# Patient Record
Sex: Male | Born: 1980 | Race: White | Hispanic: No | Marital: Single | Smoking: Current every day smoker
Health system: Southern US, Community
[De-identification: ages and names within clinical notes are randomized; demographics above are authoritative.]

## PROBLEM LIST (undated history)

## (undated) DIAGNOSIS — F32A Depression, unspecified: Secondary | ICD-10-CM

## (undated) DIAGNOSIS — Z720 Tobacco use: Secondary | ICD-10-CM

## (undated) HISTORY — DX: Depression, unspecified: F32.A

---

## 2004-05-02 ENCOUNTER — Emergency Department: Payer: Self-pay | Admitting: Emergency Medicine

## 2004-05-23 ENCOUNTER — Emergency Department: Payer: Self-pay | Admitting: Emergency Medicine

## 2005-06-09 ENCOUNTER — Emergency Department: Payer: Self-pay | Admitting: Unknown Physician Specialty

## 2005-12-13 ENCOUNTER — Ambulatory Visit: Payer: Self-pay | Admitting: Pain Medicine

## 2005-12-23 ENCOUNTER — Ambulatory Visit: Payer: Self-pay | Admitting: Pain Medicine

## 2005-12-26 ENCOUNTER — Ambulatory Visit: Payer: Self-pay | Admitting: Pain Medicine

## 2006-01-08 ENCOUNTER — Ambulatory Visit: Payer: Self-pay | Admitting: Pain Medicine

## 2006-01-18 ENCOUNTER — Ambulatory Visit: Payer: Self-pay | Admitting: Pain Medicine

## 2006-02-05 ENCOUNTER — Ambulatory Visit: Payer: Self-pay | Admitting: Pain Medicine

## 2007-04-26 ENCOUNTER — Emergency Department (HOSPITAL_COMMUNITY): Admission: EM | Admit: 2007-04-26 | Discharge: 2007-04-26 | Payer: Self-pay | Admitting: Emergency Medicine

## 2008-06-24 ENCOUNTER — Ambulatory Visit: Payer: Self-pay

## 2009-08-30 ENCOUNTER — Ambulatory Visit: Payer: Self-pay | Admitting: Family Medicine

## 2010-11-14 ENCOUNTER — Emergency Department: Payer: Self-pay | Admitting: Emergency Medicine

## 2011-07-24 IMAGING — CT CT NECK WITH CONTRAST
2 series · 10 of 14 positions shown, 12 images · IV contrast (agent unspecified)
Comparison: none

REASON FOR EXAM: l sided abcess evaluate depth
COMMENTS:

PROCEDURE:     CT  - CT NECK WITH CONTRAST  - November 14, 2010  [DATE]
RESULT:     History: Abscess.
Comparison Study: No recent.

[Series 2: soft tissue · axial · 0.49mm/px · z∈[+298,+562]mm · 8 of 114 slices shown, 10 images]
[im 13/114  soft-tissue]
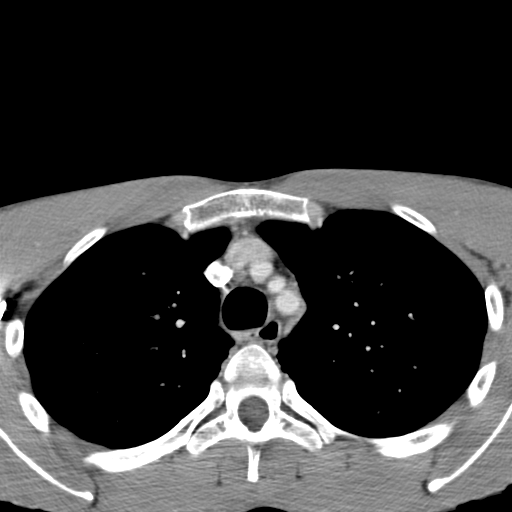
[im 13/114  bone]
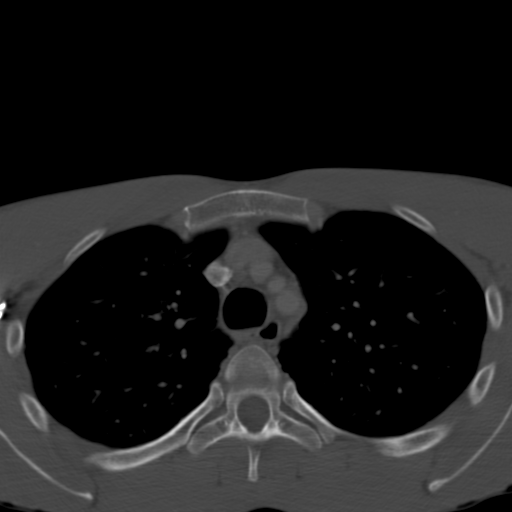
[im 26/114  bone]
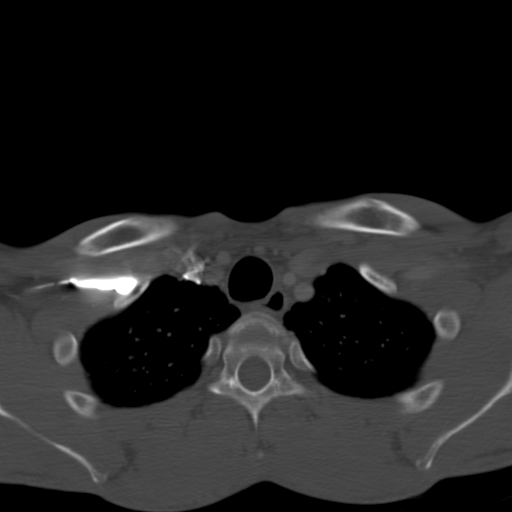
[im 38/114  bone]
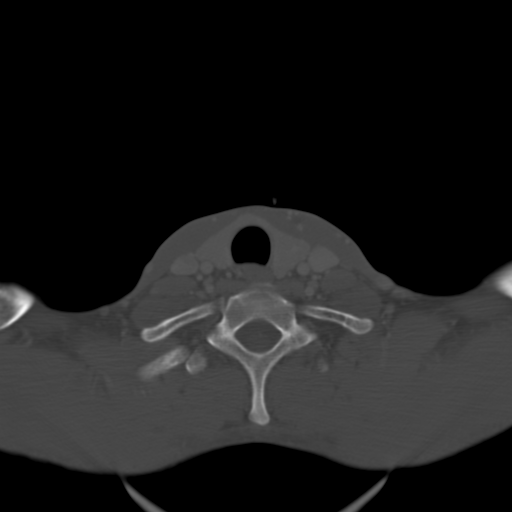
[im 51/114  bone]
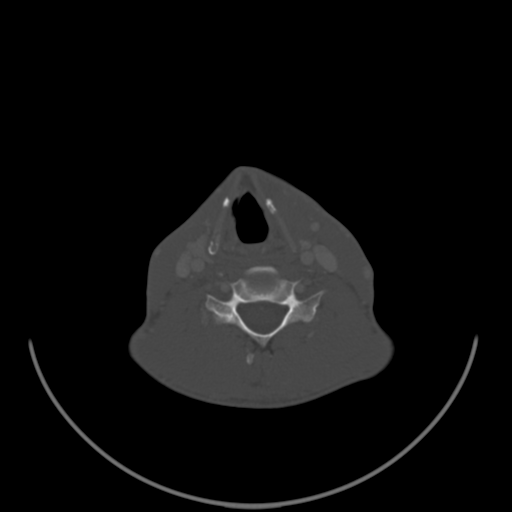
[im 63/114  soft-tissue]
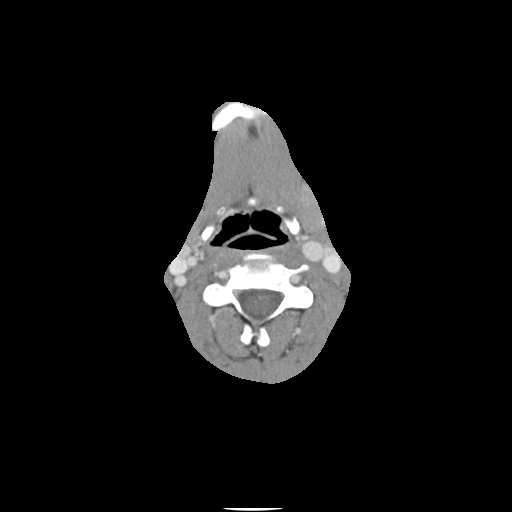
[im 63/114  bone]
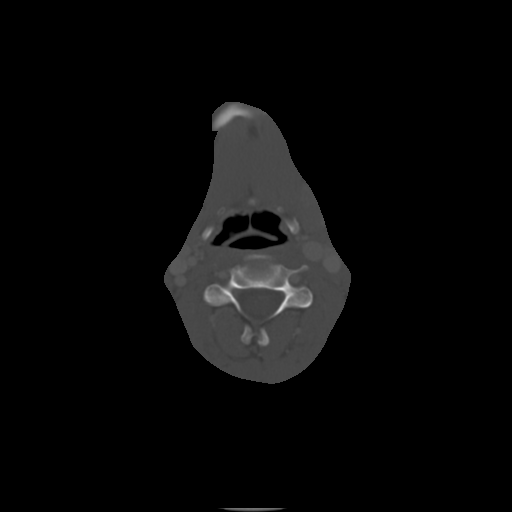
[im 76/114  bone]
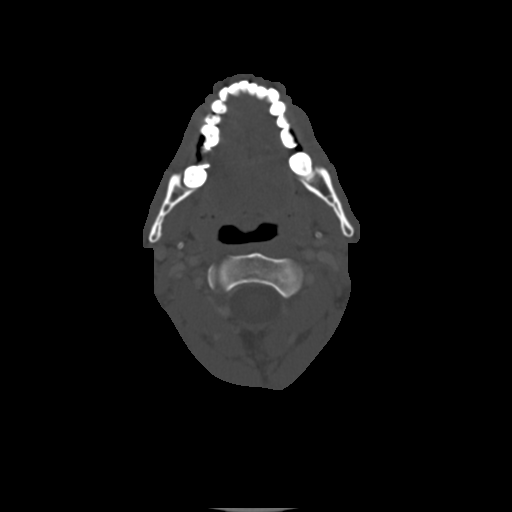
[im 88/114  bone]
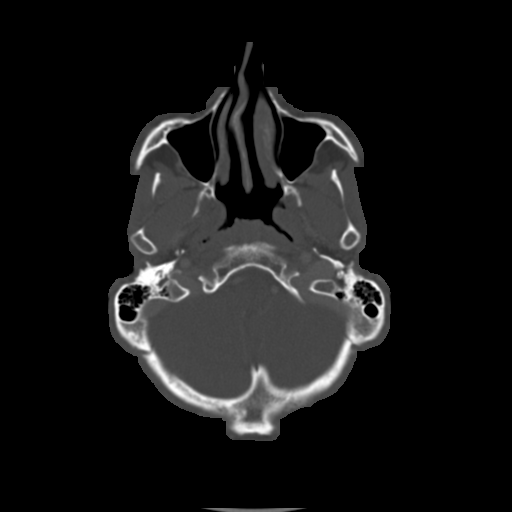
[im 101/114  bone]
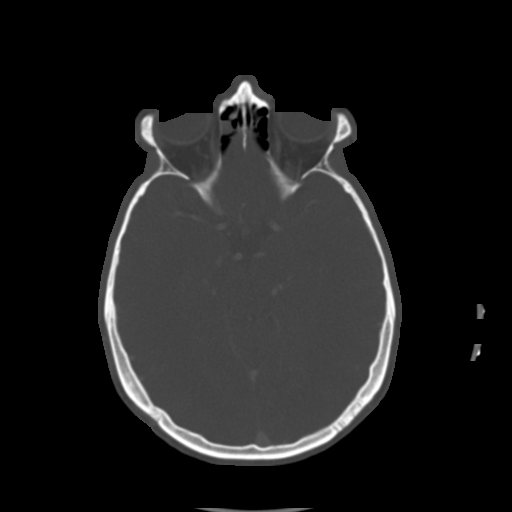

[Series 4: lung windows · axial · 0.49mm/px · z∈[+298,+334]mm · 2 of 38 slices shown]
[im 13/38  bone]
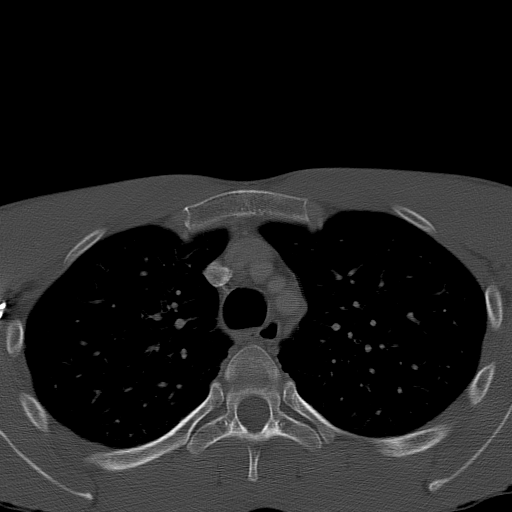
[im 25/38  bone]
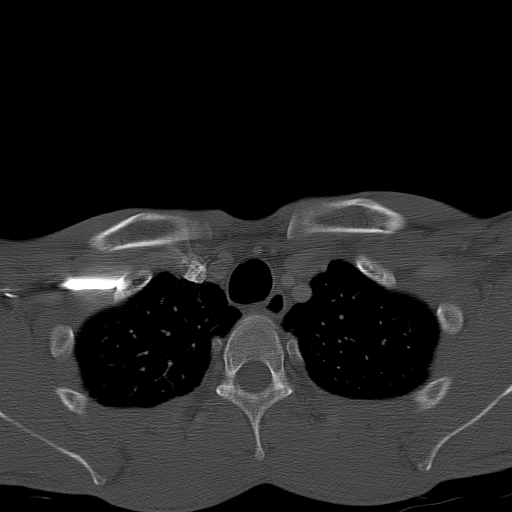

[10 of 14 positions shown; findings below may reference images not displayed]

FINDINGS: Standard CT obtained with 75 cc of [id number]. Paranasal sinuses
are clear. Larynx is normal. Salivary glands and parotid glands are normal.
There is a 2.9 x 1.6 cm complex fluid collection in the soft tissues of the
anterior neck most consistent with an abscess. Soft tissue tumor cannot be
excluded. Bilateral 1 cm jugular chain lymph nodes are noted. Vascular
structures are patent. Lung apices are normal.
IMPRESSION: 2.9 x 1.6 cm complex fluid collection in the left anterior
neck subcutaneous tissues most consistent with an abscess.

## 2012-10-07 ENCOUNTER — Emergency Department: Payer: Self-pay | Admitting: Unknown Physician Specialty
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
## 2019-10-10 ENCOUNTER — Ambulatory Visit
Admission: EM | Admit: 2019-10-10 | Discharge: 2019-10-10 | Disposition: A | Discharge status: Home / Self Care | Payer: Self-pay

## 2019-10-10 ENCOUNTER — Other Ambulatory Visit: Payer: Self-pay

## 2019-10-10 DIAGNOSIS — L0291 Cutaneous abscess, unspecified: Secondary | ICD-10-CM

## 2019-10-10 MED ORDER — SULFAMETHOXAZOLE-TRIMETHOPRIM 800-160 MG PO TABS: 1.0000 | ORAL_TABLET | Freq: Two times a day (BID) | ORAL | 0 refills | Status: AC

## 2019-10-10 NOTE — ED Provider Notes (Signed)
Roderic Palau    CSN: [id number] Arrival date & time: 10/10/19  1242      History   Chief Complaint Chief Complaint  Patient presents with  . ingrown hair    HPI DENNIE MOLTZ is a 39 y.o. male.   Patient presents with 2 ingrown hairs on his neck x2 weeks.  He attributes these to ingrown hairs and has been treating them with A&D ointment and warm compresses.  Scant intermittent drainage when he scrapes over them with his fingernail or razor.  He denies fever, chills, redness, warmth, red streaks, or other symptoms.  He denies pertinent medical history.  The history is provided by the patient.    History reviewed. No pertinent past medical history.  There are no problems to display for this patient.   History reviewed. No pertinent surgical history.     Home Medications    Prior to Admission medications   Medication Sig Start Date End Date Taking? Authorizing Provider  Vitamins A & D (VITAMIN A & D) ointment Apply 1 application topically as needed for dry skin.   Yes [provider]  sulfamethoxazole-trimethoprim (BACTRIM DS) 800-160 MG tablet Take 1 tablet by mouth 2 (two) times daily for 7 days. 10/10/19 10/17/19  Sharion Balloon, NP    Family History History reviewed. No pertinent family history.  Social History Social History   Tobacco Use  . Smoking status: Current Every Day Smoker    Packs/day: 1.00  . Smokeless tobacco: Never Used  Substance Use Topics  . Alcohol use: Not Currently  . Drug use: Yes    Frequency: 7.0 times per week    Types: Marijuana     Allergies   Patient has no known allergies.   Review of Systems Review of Systems  Constitutional: Negative for chills and fever.  HENT: Negative for ear pain and sore throat.   Eyes: Negative for pain and visual disturbance.  Respiratory: Negative for cough and shortness of breath.   Cardiovascular: Negative for chest pain and palpitations.  Gastrointestinal: Negative for  abdominal pain and vomiting.  Genitourinary: Negative for dysuria and hematuria.  Musculoskeletal: Negative for arthralgias and back pain.  Skin: Positive for wound. Negative for color change and rash.  Neurological: Negative for seizures and syncope.  All other systems reviewed and are negative.    Physical Exam Triage Vital Signs ED Triage Vitals  Enc Vitals Group     BP 10/10/19 1250 138/82     Pulse Rate 10/10/19 1250 67     Resp 10/10/19 1250 18     Temp 10/10/19 1250 98 F (36.7 C)     Temp Source 10/10/19 1250 Oral     SpO2 10/10/19 1250 99 %     Weight --      Height --      Head Circumference --      Peak Flow --      Pain Score 10/10/19 1248 4     Pain Loc --      Pain Edu? --      Excl. in [place]? --    No data found.  Updated Vital Signs BP 138/82 (BP Location: Left Arm)   Pulse 67   Temp 98 F (36.7 C) (Oral)   Resp 18   SpO2 99%   Visual Acuity Right Eye Distance:   Left Eye Distance:   Bilateral Distance:    Right Eye Near:   Left Eye Near:    Bilateral  Near:     Physical Exam Vitals and nursing note reviewed.  Constitutional:      Appearance: He is well-developed.  HENT:     Head: Normocephalic and atraumatic.     Mouth/Throat:     Mouth: Mucous membranes are moist.     Pharynx: Oropharynx is clear.  Eyes:     Conjunctiva/sclera: Conjunctivae normal.  Neck:   Cardiovascular:     Rate and Rhythm: Normal rate and regular rhythm.     Heart sounds: No murmur.  Pulmonary:     Effort: Pulmonary effort is normal. No respiratory distress.     Breath sounds: Normal breath sounds.  Abdominal:     Palpations: Abdomen is soft.     Tenderness: There is no abdominal tenderness. There is no guarding or rebound.  Musculoskeletal:     Cervical back: Neck supple.  Skin:    General: Skin is warm and dry.     Findings: Lesion present.     Comments: Abscess on right side of neck: 1 cm x 4 mm area of induration. Scant bloody drainage when opened.    Abscess on left side of neck: 1.5 cm x 1.5 cm area of induration. Moderate purulent drainage when opened.    Neurological:     General: No focal deficit present.     Mental Status: He is alert and oriented to person, place, and time.     Gait: Gait normal.  Psychiatric:        Mood and Affect: Mood normal.        Behavior: Behavior normal.      UC Treatments / Results  Labs (all labs ordered are listed, but only abnormal results are displayed) Labs Reviewed - No data to display  EKG   Radiology No results found.  Procedures Incision and Drainage  Date/Time: 10/10/2019 1:28 PM Performed by: Mickie Bail, NP Authorized by: Mickie Bail, NP   Consent:    Consent obtained:  Verbal   Consent given by:  Patient   Risks discussed:  Bleeding, incomplete drainage, infection and pain Location:    Type:  Abscess   Location:  Neck Pre-procedure details:    Skin preparation:  Antiseptic wash Anesthesia (see MAR for exact dosages):    Anesthesia method:  Local infiltration   Local anesthetic:  Lidocaine 1% w/o epi Procedure details:    Incision types:  Stab incision   Scalpel blade:  11   Drainage:  Purulent and bloody   Wound treatment:  Wound left open   Packing materials:  None Post-procedure details:    Patient tolerance of procedure:  Tolerated well, no immediate complications   (including critical care time)  Medications Ordered in UC Medications - No data to display  Initial Impression / Assessment and Plan / UC Course  I have reviewed the triage vital signs and the nursing notes.  Pertinent labs & imaging results that were available during my care of the patient were reviewed by me and considered in my medical decision making (see chart for details).   Abscess.  I&D performed.  Treating with Septra DS.  Wound care instructions and signs of worsening infection discussed.  Instructed patient to follow up with his PCP or return here if he has signs of worsening  infection.  Patient agrees to plan of care.     Final Clinical Impressions(s) / UC Diagnoses   Final diagnoses:  Abscess     Discharge Instructions     Keep  your wound clean and dry.  Wash it gently twice a day with soap and water.  Apply the antibiotic cream twice a day.    Take the antibiotic as directed.    Return here if you see signs of increased infection, such as increased pain, redness, red streaks, warmth, fever, chills, or other concerning symptoms.         ED Prescriptions    Medication Sig Dispense Auth. Provider   sulfamethoxazole-trimethoprim (BACTRIM DS) 800-160 MG tablet Take 1 tablet by mouth 2 (two) times daily for 7 days. 14 tablet Mickie Bail, NP     PDMP not reviewed this encounter.   Mickie Bail, NP 10/10/19 1330

## 2019-10-10 NOTE — ED Triage Notes (Signed)
Pt states he is having ingrown hair in his neck x 2 weeks. Pt states he is using And D ointment and hot compress without relief.

## 2019-10-10 NOTE — Discharge Instructions (Signed)
Keep your wound clean and dry.  Wash it gently twice a day with soap and water.  Apply the antibiotic cream twice a day.    Take the antibiotic as directed.    Return here if you see signs of increased infection, such as increased pain, redness, red streaks, warmth, fever, chills, or other concerning symptoms.

## 2022-07-24 ENCOUNTER — Emergency Department
Admission: EM | Admit: 2022-07-24 | Discharge: 2022-07-24 | Disposition: A | Discharge status: Home / Self Care | Payer: Worker's Compensation | Attending: Emergency Medicine | Admitting: Emergency Medicine

## 2022-07-24 ENCOUNTER — Emergency Department: Payer: Worker's Compensation

## 2022-07-24 ENCOUNTER — Encounter: Payer: Self-pay | Admitting: Emergency Medicine

## 2022-07-24 DIAGNOSIS — W540XXA Bitten by dog, initial encounter: Secondary | ICD-10-CM | POA: Diagnosis not present

## 2022-07-24 DIAGNOSIS — S60511A Abrasion of right hand, initial encounter: Secondary | ICD-10-CM | POA: Diagnosis not present

## 2022-07-24 DIAGNOSIS — S65301A Unspecified injury of deep palmar arch of right hand, initial encounter: Secondary | ICD-10-CM | POA: Diagnosis present

## 2022-07-24 DIAGNOSIS — Z23 Encounter for immunization: Secondary | ICD-10-CM | POA: Insufficient documentation

## 2022-07-24 MED ORDER — AMOXICILLIN-POT CLAVULANATE 875-125 MG PO TABS: 1.0000 | ORAL_TABLET | Freq: Two times a day (BID) | ORAL | 0 refills | Status: DC

## 2022-07-24 MED ORDER — TETANUS-DIPHTH-ACELL PERTUSSIS 5-2.5-18.5 LF-MCG/0.5 IM SUSY
0.5000 mL | PREFILLED_SYRINGE | Freq: Once | INTRAMUSCULAR | Status: AC
  Administered 2022-07-24: 0.5 mL via INTRAMUSCULAR
  Filled 2022-07-24: qty 0.5
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
## 2022-07-24 NOTE — Discharge Instructions (Addendum)
-  Please take the full course of the antibiotics as prescribed.  Patient are to wash the wound daily with soap and water.  -You may take Tylenol/ibuprofen as needed for pain.  -Return to the emergency department anytime if you begin to experience any new or worsening symptoms.
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
## 2022-07-24 NOTE — ED Notes (Signed)
Patient did not present with any paperwork for a workmans comp claim.

## 2022-07-31 ENCOUNTER — Emergency Department
Admission: EM | Admit: 2022-07-31 | Discharge: 2022-07-31 | Disposition: A | Discharge status: Home / Self Care | Payer: Self-pay | Attending: Emergency Medicine | Admitting: Emergency Medicine

## 2022-07-31 ENCOUNTER — Other Ambulatory Visit: Payer: Self-pay

## 2022-07-31 ENCOUNTER — Encounter: Payer: Self-pay | Admitting: Emergency Medicine

## 2022-07-31 DIAGNOSIS — B029 Zoster without complications: Secondary | ICD-10-CM | POA: Insufficient documentation

## 2022-07-31 LAB — BASIC METABOLIC PANEL
Anion gap: 11 (ref 5–15)
BUN: 18 mg/dL (ref 6–20)
CO2: 25 mmol/L (ref 22–32)
Calcium: 8.9 mg/dL (ref 8.9–10.3)
Chloride: 100 mmol/L (ref 98–111)
Creatinine, Ser: 0.94 mg/dL (ref 0.61–1.24)
GFR, Estimated: >60 mL/min (ref >60)
Glucose, Bld: 107 mg/dL — ABNORMAL HIGH (ref 70–99)
Potassium: 3.6 mmol/L (ref 3.5–5.1)
Sodium: 136 mmol/L (ref 135–145)

## 2022-07-31 MED ORDER — KETOROLAC TROMETHAMINE 15 MG/ML IJ SOLN
15.0000 mg | Freq: Once | INTRAMUSCULAR | Status: AC
  Administered 2022-07-31: 15 mg via INTRAVENOUS
  Filled 2022-07-31: qty 1

## 2022-07-31 MED ORDER — VALACYCLOVIR HCL 1 G PO TABS: 1000.0000 mg | ORAL_TABLET | Freq: Three times a day (TID) | ORAL | 0 refills | Status: AC

## 2022-07-31 NOTE — Discharge Instructions (Signed)
Please start taking the Valtrex as soon as possible for what appears to be shingles.  Please return for any new, worsening, or change in symptoms or other concerns including changes to your vision or eye pain.  Please follow-up with your primary care provider for recheck this week.  It was a pleasure caring for you today.  Please go to the following website to schedule new (and existing) patient appointments:   [email or web address]   The following is a list of primary care offices in the area who are accepting new patients at this time.  Please reach out to one of them directly and let them know you would like to schedule an appointment to follow up on an Emergency Department visit, and/or to establish a new primary care provider (PCP).  There are likely other primary care clinics in the are who are accepting new patients, but this is an excellent place to start:  [place] physician: Dr Lavon Paganini [address] [place], [place] [address] [phone]  [facility] Lead Physician: Dr Steele Sizer [address], [place], [place] [address] [phone]  [place] Physician: Dr Park Liter [address] [place], [place] [address] [phone]  [facility] Lead Physician: Dr Dewaine Oats [address] [place], [place] [address] [phone]  [facility] Physician: Dr Halina Maidens [address] [place], [place], [place] [address] [phone]

## 2022-07-31 NOTE — ED Triage Notes (Signed)
Pt to ED POV for right sided dental pain for past few days. Seen at [facility] yesterday and started on antibiotic. NAD noted.

## 2022-07-31 NOTE — ED Provider Notes (Signed)
[facility] Provider Note    None    (approximate)   History   Dental Pain   HPI  Terry Reyes is a 42 y.o. male who presents today for evaluation of rash to the right side of his face which he describes as burning in nature.  Rash began 3 days ago but he feels it is worsening. He reports that he thought that he had a dental infection and so went to urgent care and they prescribed antibiotics.  He reports that the rash has gotten progressively worse and now involves the right side of his scalp.  No fevers or chills.  He reports that he had chickenpox as a child.  No trouble opening his mouth.  No trouble swallowing.     Physical Exam   Triage Vital Signs: ED Triage Vitals  Enc Vitals Group     BP 07/31/22 0708 (!) 130/98     Pulse Rate 07/31/22 0708 89     Resp 07/31/22 0708 16     Temp 07/31/22 0708 97.9 F (36.6 C)     Temp Source 07/31/22 0708 Oral     SpO2 07/31/22 0708 97 %     Weight 07/31/22 0709 134 lb 7.7 oz (61 kg)     Height 07/31/22 0709 5' 11"$  (1.803 m)     Head Circumference --      Peak Flow --      Pain Score 07/31/22 0708 3     Pain Loc --      Pain Edu? --      Excl. in [place]? --     Most recent vital signs: Vitals:   07/31/22 0708  BP: (!) 130/98  Pulse: 89  Resp: 16  Temp: 97.9 F (36.6 C)  SpO2: 97%    Physical Exam Vitals and nursing note reviewed.  Constitutional:      General: Awake and alert. No acute distress.    Appearance: Normal appearance. The patient is normal weight.  HENT:     Head: Normocephalic and atraumatic.     Mouth: Mucous membranes are moist.  Diffusely poor dentition.  No trismus.  No sublingual swelling.  No uvular deviation. Eyes:     General: PERRL. Normal EOMs        Right eye: No discharge.        Left eye: No discharge.     Conjunctiva/sclera: Conjunctivae normal.  Cardiovascular:     Rate and Rhythm: Normal rate and regular rhythm.     Pulses: Normal pulses.     Heart sounds:  Normal heart sounds Pulmonary:     Effort: Pulmonary effort is normal. No respiratory distress.     Breath sounds: Normal breath sounds.  Abdominal:     Abdomen is soft. There is no abdominal tenderness. No rebound or guarding. No distention. Musculoskeletal:        General: No swelling. Normal range of motion.     Cervical back: Normal range of motion and neck supple.  Skin:    General: Skin is warm and dry.     Capillary Refill: Capillary refill takes less than 2 seconds.     Findings: Vesicular rash to the right side of face with a scabbed over lesion noted to chin, and new vesicles noted to hands right side of jaw. No involvement of nose or periorbital area. No hutchens sign Neurological:     Mental Status: The patient is awake and alert.  ED Results / Procedures / Treatments   Labs (all labs ordered are listed, but only abnormal results are displayed) Labs Reviewed  BASIC METABOLIC PANEL - Abnormal; Notable for the following components:      Result Value   Glucose, Bld 107 (*)    All other components within normal limits     EKG     RADIOLOGY     PROCEDURES:  Critical Care performed:   Procedures   MEDICATIONS ORDERED IN ED: Medications  ketorolac (TORADOL) 15 MG/ML injection 15 mg (15 mg Intravenous Given 07/31/22 0730)     IMPRESSION / MDM / ASSESSMENT AND PLAN / ED COURSE  I reviewed the triage vital signs and the nursing notes.   Differential diagnosis includes, but is not limited to, shingles, dental infection, dental decay, dental caries, contact dermatitis, allergic reaction.  Patient is awake and alert, hemodynamically stable and afebrile.  He has a vesicular rash in various stages of healing to the right side of his face and into the top of his scalp consistent with C2 dermatome.  There is negative Hutchinson sign, do not suspect eye involvement.  There is no periorbital or lid involvement.  I reviewed the patient's chart.  He has not had any  recent laboratory testing, therefore creatinine obtained for dosing of Valtrex.  He was treated symptomatically while in the ER.  From a dental standpoint, he is diffusely poor dentition, however there is no gingival fluctuance to suggest abscess.  There is no sublingual swelling to suggest Ludwig's angina.  He has no trismus.  There is no facial swelling or neck swelling.  Kidney function is within normal limits.  He was started on Valtrex.  We discussed the importance of starting this is soon as possible.  We also discussed symptomatic management.  We discussed strict return precautions.  Patient or stands and agrees with plan.  He was discharged in stable condition.  Patient's presentation is most consistent with acute complicated illness / injury requiring diagnostic workup.    FINAL CLINICAL IMPRESSION(S) / ED DIAGNOSES   Final diagnoses:  Herpes zoster without complication     Rx / DC Orders   ED Discharge Orders          Ordered    valACYclovir (VALTREX) 1000 MG tablet  3 times daily        07/31/22 0753             Note:  This document was prepared using Dragon voice recognition software and may include unintentional dictation errors.   Emeline Gins 07/31/22 [id number]    Lavonia Drafts, MD 07/31/22 1000

## 2022-08-01 ENCOUNTER — Inpatient Hospital Stay
Admission: EM | Admit: 2022-08-01 | Discharge: 2022-08-04 | DRG: 596 | Disposition: A | Discharge status: Home / Self Care | Payer: PRIVATE HEALTH INSURANCE | Attending: Internal Medicine | Admitting: Internal Medicine

## 2022-08-01 ENCOUNTER — Encounter: Payer: Self-pay | Admitting: Emergency Medicine

## 2022-08-01 ENCOUNTER — Other Ambulatory Visit: Payer: Self-pay

## 2022-08-01 DIAGNOSIS — B029 Zoster without complications: Secondary | ICD-10-CM | POA: Diagnosis not present

## 2022-08-01 DIAGNOSIS — Z720 Tobacco use: Secondary | ICD-10-CM | POA: Diagnosis not present

## 2022-08-01 DIAGNOSIS — K047 Periapical abscess without sinus: Secondary | ICD-10-CM | POA: Diagnosis present

## 2022-08-01 DIAGNOSIS — Z8249 Family history of ischemic heart disease and other diseases of the circulatory system: Secondary | ICD-10-CM

## 2022-08-01 DIAGNOSIS — F1210 Cannabis abuse, uncomplicated: Secondary | ICD-10-CM | POA: Diagnosis present

## 2022-08-01 DIAGNOSIS — B028 Zoster with other complications: Principal | ICD-10-CM

## 2022-08-01 DIAGNOSIS — Z83438 Family history of other disorder of lipoprotein metabolism and other lipidemia: Secondary | ICD-10-CM

## 2022-08-01 DIAGNOSIS — F17210 Nicotine dependence, cigarettes, uncomplicated: Secondary | ICD-10-CM | POA: Diagnosis present

## 2022-08-01 DIAGNOSIS — R21 Rash and other nonspecific skin eruption: Secondary | ICD-10-CM | POA: Diagnosis not present

## 2022-08-01 HISTORY — DX: Tobacco use: Z72.0

## 2022-08-01 LAB — PATHOLOGIST SMEAR REVIEW

## 2022-08-01 LAB — CBC WITH DIFFERENTIAL/PLATELET
Abs Immature Granulocytes: 0.03 10*3/uL (ref 0.00–0.07)
Basophils Absolute: 0.1 10*3/uL (ref 0.0–0.1)
Basophils Relative: 1 %
Eosinophils Absolute: 0 10*3/uL (ref 0.0–0.5)
Eosinophils Relative: 0 %
HCT: 43.3 % (ref 39.0–52.0)
Hemoglobin: 14.7 g/dL (ref 13.0–17.0)
Immature Granulocytes: 0 %
Lymphocytes Relative: 23 %
Lymphs Abs: 1.6 10*3/uL (ref 0.7–4.0)
MCH: 29.9 pg (ref 26.0–34.0)
MCHC: 33.9 g/dL (ref 30.0–36.0)
MCV: 88 fL (ref 80.0–100.0)
Monocytes Absolute: 0.9 10*3/uL (ref 0.1–1.0)
Monocytes Relative: 14 %
Neutro Abs: 4.2 10*3/uL (ref 1.7–7.7)
Neutrophils Relative %: 62 %
Platelets: 174 10*3/uL (ref 150–400)
RBC: 4.92 MIL/uL (ref 4.22–5.81)
RDW: 11.9 % (ref 11.5–15.5)
Smear Review: NORMAL
WBC: 6.7 10*3/uL (ref 4.0–10.5)
nRBC: 0 % (ref 0.0–0.2)

## 2022-08-01 LAB — COMPREHENSIVE METABOLIC PANEL
ALT: 71 U/L — ABNORMAL HIGH (ref 0–44)
AST: 62 U/L — ABNORMAL HIGH (ref 15–41)
Albumin: 3.8 g/dL (ref 3.5–5.0)
Alkaline Phosphatase: 43 U/L (ref 38–126)
Anion gap: 9 (ref 5–15)
BUN: 19 mg/dL (ref 6–20)
CO2: 25 mmol/L (ref 22–32)
Calcium: 8.6 mg/dL — ABNORMAL LOW (ref 8.9–10.3)
Chloride: 98 mmol/L (ref 98–111)
Creatinine, Ser: 0.79 mg/dL (ref 0.61–1.24)
GFR, Estimated: >60 mL/min (ref >60)
Glucose, Bld: 104 mg/dL — ABNORMAL HIGH (ref 70–99)
Potassium: 3.5 mmol/L (ref 3.5–5.1)
Sodium: 132 mmol/L — ABNORMAL LOW (ref 135–145)
Total Bilirubin: 0.5 mg/dL (ref 0.3–1.2)
Total Protein: 7.5 g/dL (ref 6.5–8.1)

## 2022-08-01 LAB — PROTIME-INR
INR: 1.1 (ref 0.8–1.2)
Prothrombin Time: 14.2 seconds (ref 11.4–15.2)

## 2022-08-01 LAB — SEDIMENTATION RATE: Sed Rate: 11 mm/hr (ref 0–15)

## 2022-08-01 LAB — LACTIC ACID, PLASMA
Lactic Acid, Venous: 0.9 mmol/L (ref 0.5–1.9)
Lactic Acid, Venous: 0.7 mmol/L (ref 0.5–1.9)

## 2022-08-01 LAB — PROCALCITONIN: Procalcitonin: <0.1 ng/mL

## 2022-08-01 LAB — RPR: RPR Ser Ql: NONREACTIVE

## 2022-08-01 LAB — HIV ANTIBODY (ROUTINE TESTING W REFLEX): HIV Screen 4th Generation wRfx: NONREACTIVE

## 2022-08-01 MED ORDER — METRONIDAZOLE 500 MG/100ML IV SOLN
500.0000 mg | Freq: Two times a day (BID) | INTRAVENOUS | Status: DC
  Administered 2022-08-01 – 2022-08-03 (×5): 500 mg via INTRAVENOUS
  Filled 2022-08-01 (×5): qty 100

## 2022-08-01 MED ORDER — SODIUM CHLORIDE 0.9 % IV SOLN
INTRAVENOUS | Status: DC
  Administered 2022-08-04: 1000 mL via INTRAVENOUS

## 2022-08-01 MED ORDER — DEXTROSE 5 % IV SOLN
10.0000 mg/kg | Freq: Three times a day (TID) | INTRAVENOUS | Status: DC
  Administered 2022-08-01 – 2022-08-03 (×7): 610 mg via INTRAVENOUS
  Filled 2022-08-01 (×7): qty 12.2
  Filled 2022-08-01: qty 10

## 2022-08-01 MED ORDER — SODIUM CHLORIDE 0.9 % IV SOLN: Freq: Once | INTRAVENOUS | Status: AC

## 2022-08-01 MED ORDER — NICOTINE 21 MG/24HR TD PT24
21.0000 mg | MEDICATED_PATCH | Freq: Every day | TRANSDERMAL | Status: DC
  Filled 2022-08-01 (×3): qty 1

## 2022-08-01 MED ORDER — CEFAZOLIN SODIUM-DEXTROSE 2-4 GM/100ML-% IV SOLN
2.0000 g | Freq: Three times a day (TID) | INTRAVENOUS | Status: DC
  Administered 2022-08-01 – 2022-08-03 (×6): 2 g via INTRAVENOUS
  Filled 2022-08-01 (×6): qty 100

## 2022-08-01 MED ORDER — ACETAMINOPHEN 325 MG PO TABS
650.0000 mg | ORAL_TABLET | Freq: Four times a day (QID) | ORAL | Status: DC | PRN
  Administered 2022-08-02: 650 mg via ORAL
  Filled 2022-08-01: qty 2

## 2022-08-01 MED ORDER — HEPARIN SODIUM (PORCINE) 5000 UNIT/ML IJ SOLN: 5000.0000 [IU] | Freq: Three times a day (TID) | INTRAMUSCULAR | Status: DC

## 2022-08-01 MED ORDER — ONDANSETRON HCL 4 MG/2ML IJ SOLN: 4.0000 mg | Freq: Three times a day (TID) | INTRAMUSCULAR | Status: DC | PRN

## 2022-08-01 MED ORDER — ENOXAPARIN SODIUM 40 MG/0.4ML IJ SOSY
40.0000 mg | PREFILLED_SYRINGE | Freq: Every day | INTRAMUSCULAR | Status: DC
  Administered 2022-08-01 – 2022-08-03 (×3): 40 mg via SUBCUTANEOUS
  Filled 2022-08-01 (×4): qty 0.4

## 2022-08-01 MED ORDER — OXYCODONE-ACETAMINOPHEN 5-325 MG PO TABS
1.0000 | ORAL_TABLET | ORAL | Status: DC | PRN
  Administered 2022-08-01 – 2022-08-04 (×5): 1 via ORAL
  Filled 2022-08-01 (×5): qty 1

## 2022-08-01 MED ORDER — MORPHINE SULFATE (PF) 4 MG/ML IV SOLN
4.0000 mg | Freq: Once | INTRAVENOUS | Status: AC
  Administered 2022-08-01: 4 mg via INTRAVENOUS
  Filled 2022-08-01: qty 1

## 2022-08-01 MED ORDER — DEXTROSE 5 % IV SOLN
10.0000 mg/kg | Freq: Once | INTRAVENOUS | Status: AC
  Administered 2022-08-01: 610 mg via INTRAVENOUS
  Filled 2022-08-01: qty 12.2

## 2022-08-01 MED ORDER — SODIUM CHLORIDE 0.9 % IV SOLN: Freq: Once | INTRAVENOUS | Status: DC

## 2022-08-01 MED ORDER — ONDANSETRON HCL 4 MG/2ML IJ SOLN
4.0000 mg | INTRAMUSCULAR | Status: AC
  Administered 2022-08-01: 4 mg via INTRAVENOUS
  Filled 2022-08-01: qty 2

## 2022-08-01 NOTE — ED Provider Notes (Signed)
[facility] Provider Note    Event Date/Time   First MD Initiated Contact with Patient 08/01/22 0411     (approximate)   History   Facial Pain   HPI  Terry Reyes is a 42 y.o. male who presents for worsening pain and rash primarily to the right side of his face and head.  The symptoms have been going on for about 5 days.  He came to the emergency department yesterday and was diagnosed with shingles on his face.  He was started on Valtrex and discharged home.  However he said that he has gotten significantly worse since yesterday.  He has not been able to eat or drink much at all for the last few days due to the pain in his mouth and on his lips.  He feels ill in general.  No known fever or chills but general malaise.  The pain is severe all along the right side of his face and he said he now has lesions on the top of his head and all throughout his scalp.  He also has some on the back of his neck.  He said that he feels like he is getting closer to his eye and now his eyelid is shut some of the time.  He does not have pain in his right eye and his vision does not seem to be compromised when his eyes are open wide.  He is unaware of any immunosuppression including HIV.  He does not take any medications regularly.  He recently took a course of Augmentin for a dog bite to his hand.  He said that the hand is doing much better.  The rash did start after the amoxicillin but does seem to be isolated to the right side of his face and head.     Physical Exam   Triage Vital Signs: ED Triage Vitals  Enc Vitals Group     BP 08/01/22 0357 (!) 121/99     Pulse Rate 08/01/22 0357 (!) 109     Resp 08/01/22 0357 16     Temp 08/01/22 0357 98.9 F (37.2 C)     Temp Source 08/01/22 0357 Oral     SpO2 08/01/22 0357 96 %     Weight 08/01/22 0358 61.2 kg (135 lb)     Height 08/01/22 0358 1.778 m (5' 10"$ )     Head Circumference --      Peak Flow --      Pain Score 08/01/22  0358 3     Pain Loc --      Pain Edu? --      Excl. in [place]? --     Most recent vital signs: Vitals:   08/01/22 0700 08/01/22 0714  BP: 122/76   Pulse: 73   Resp: 18   Temp:  99.5 F (37.5 C)  SpO2: 97%      General: Awake, ill-appearing if not toxic.  Alert and oriented. CV:  Good peripheral perfusion.  Regular rate and rhythm. Resp:  Normal effort. No accessory muscle usage nor intercostal retractions.   Abd:  No distention.  No tenderness to palpation. Other:  Patient has extensive vesicular lesions all throughout the right side of his face.  Some of these are crusted and some of them are more fresh and open.  He has crusting and dried and cracked right side of his lower lip as well as intraoral mucosal lesions.  He has right eyelid droop and periorbital and  lid edema.  There is no obvious conjunctivitis nor episcleritis.  Extraocular motion is intact and pupils are equal and reactive.  No lesions within the ear canal which would be suggestive of Ramsay Hunt syndrome.  He has lesions on the back of his neck as well as throughout the right side of his scalp as well.  I do not see any lesions on his hands or arms.         ED Results / Procedures / Treatments   Labs (all labs ordered are listed, but only abnormal results are displayed) Labs Reviewed  COMPREHENSIVE METABOLIC PANEL - Abnormal; Notable for the following components:      Result Value   Sodium 132 (*)    Glucose, Bld 104 (*)    Calcium 8.6 (*)    AST 62 (*)    ALT 71 (*)    All other components within normal limits  CULTURE, BLOOD (ROUTINE X 2)  CULTURE, BLOOD (ROUTINE X 2)  LACTIC ACID, PLASMA  CBC WITH DIFFERENTIAL/PLATELET  PROTIME-INR  HIV ANTIBODY (ROUTINE TESTING W REFLEX)  LACTIC ACID, PLASMA  RPR  PATHOLOGIST SMEAR REVIEW     PROCEDURES:  Critical Care performed: No  Procedures   MEDICATIONS ORDERED IN ED: Medications  0.9 %  sodium chloride infusion (has no administration in time  range)  morphine (PF) 4 MG/ML injection 4 mg (4 mg Intravenous Given 08/01/22 0712)  ondansetron (ZOFRAN) injection 4 mg (4 mg Intravenous Given 08/01/22 [id number])  acyclovir (ZOVIRAX) 610 mg in dextrose 5 % 100 mL IVPB (610 mg Intravenous New Bag/Given 08/01/22 0712)  0.9 %  sodium chloride infusion ( Intravenous New Bag/Given 08/01/22 0711)     IMPRESSION / MDM / ASSESSMENT AND PLAN / ED COURSE  I reviewed the triage vital signs and the nursing notes.                              Differential diagnosis includes, but is not limited to, herpes zoster, disseminated herpes zoster, herpes zoster ophthalmicus, contact dermatitis, bullous pemphigoid, drug-induced bullous disorders, SJS/TN, varicella, erythema multiforme.  Patient's presentation is most consistent with acute presentation with potential threat to life or bodily function.  Labs/studies ordered: CBC with differential, lactic acid, blood cultures x 2, CMP, pro time-INR, RPR, HIV antibody with reflex. Interventions/Medications given: Acyclovir 10 mg/kg IV (per pharmacy consult), Zofran 4 mg IV, morphine 4 mg IV, normal saline infusion at 125 mL/h. [facility] Course my include additional interventions not listed in this section:)  The patient has impressive and concerning herpes zoster on the right side of his face that is progressed substantially over the last 24 hours and spite of starting outpatient treatment of valacyclovir.  Most concerning is the pain he is having in his mouth that is making it very difficult for him to tolerate oral intake, as well as the fact that the rash is spreading closer and closer to his eye.  He now has lid droop and periorbital/eyelid edema.  There is no obvious involvement of the globe at this time but I feel he would benefit from a nonemergent ophthalmic consultation for additional evaluation.    Given the patient's pain, inability tolerate oral intake to take his medicines or stay hydrated, and concerning for  relatively rapidly worsening shingles with possible ocular involvement in the setting of failed outpatient treatment, I feel patient would benefit from inpatient treatment on the hospital service with possible consults to  ophthalmology and infectious disease.  I talked about this plan with the patient and he is very much in agreement.  I ordered acyclovir IV per pharmacy consult for appropriate dosing.  Medications as listed above for pain and antiemetics.  He is not tachycardic so I will provide normal saline infusion at 125 an hour rather than boluses at this time.  Once labs are back I will consult the hospitalist team.  The patient is on the cardiac monitor to evaluate for evidence of arrhythmia and/or significant heart rate changes.   Clinical Course as of 08/01/22 0815  Tue Aug 01, 2022  [id number] Lactic Acid, Venous: 0.9 [CF]  [id number] Lab results are generally reassuring with no emergent abnormalities.  No evidence of (bacterial) sepsis.  Consulting the hospitalist for admission as previously described. [CF]  [id number] Consulted Dr. Blaine Hamper with the hospitalist team who will admit. [CF]    Clinical Course User Index [CF] Hinda Kehr, MD     FINAL CLINICAL IMPRESSION(S) / ED DIAGNOSES   Final diagnoses:  Herpes zoster with other complication     Rx / DC Orders   ED Discharge Orders     None        Note:  This document was prepared using Dragon voice recognition software and may include unintentional dictation errors.   Hinda Kehr, MD 08/01/22 [phone]

## 2022-08-01 NOTE — Consult Note (Signed)
NAME: Terry Reyes  DOB: Mar 12, 1981  MRN: [id number]  Date/Time: 08/01/2022 1:30 PM  REQUESTING PROVIDER: Dr.Niu Subjective:  REASON FOR CONSULT: Shingles ?virtual video visit Terry Reyes is a 42 y.o. 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 No pertinent surgical history.  Social History   Socioeconomic History   Marital status: Single    Spouse name: Not on file   Number of children: Not on file   Years of education: Not on file   Highest education level: Not on file  Occupational History   Not on file  Tobacco Use   Smoking status: Every Day    Packs/day: 1.00    Types: Cigarettes   Smokeless tobacco: Never  Substance and Sexual Activity   Alcohol use: Not Currently   Drug use: Yes    Frequency: 7.0 times per week    Types: Marijuana   Sexual activity: Not Currently  Other Topics Concern   Not on file  Social History Narrative   Not on file   Social Determinants of Health   Financial Resource Strain: Not on file  Food Insecurity: Not on file  Transportation Needs: Not on file  Physical Activity: Not on file  Stress: Not on file  Social Connections: Not on file  Intimate Partner Violence: Not on file    Family History  Problem Relation Age of Onset   Hyperlipidemia Father    Seizures Father    Hypertension Father     No Known Allergies I? Current Facility-Administered Medications  Medication Dose Route Frequency Provider Last Rate Last Admin   0.9 %  sodium chloride infusion   Intravenous Continuous Ivor Costa, MD       acetaminophen (TYLENOL) tablet 650 mg  650 mg Oral Q6H PRN Ivor Costa, MD       acyclovir (ZOVIRAX) 610 mg in dextrose 5 % 100 mL IVPB  10 mg/kg Intravenous Q8H Miller, Justin E, RPH       enoxaparin (LOVENOX) injection 40 mg  40 mg Subcutaneous Daily Ivor Costa, MD   40 mg at 08/01/22 1118   nicotine (NICODERM CQ - dosed in mg/24 hours) patch 21 mg  21 mg Transdermal Daily Ivor Costa, MD       ondansetron [facility]) injection 4 mg  4 mg Intravenous Q8H PRN Ivor Costa, MD       oxyCODONE-acetaminophen (PERCOCET/ROXICET) 5-325 MG per tablet 1 tablet  1 tablet Oral Q4H PRN Ivor Costa, MD       Current Outpatient Medications  Medication Sig Dispense Refill   amoxicillin-clavulanate (AUGMENTIN) 875-125 MG tablet Take 1 tablet by mouth 2 (two) times daily for 10 days. 20 tablet 0   clindamycin (CLEOCIN) 300 MG capsule  Take 300 mg by mouth every 8 (eight) hours.     valACYclovir (VALTREX) 1000 MG tablet Take 1 tablet (1,000 mg total) by mouth 3 (three) times daily for 7 days. 21 tablet 0   Vitamins A & D (VITAMIN A & D) ointment Apply 1 application topically as needed for dry skin.       Abtx:  Anti-infectives (From admission, onward)    Start     Dose/Rate Route Frequency Ordered Stop   08/01/22 1400  acyclovir (ZOVIRAX) 610 mg in dextrose 5 % 100 mL IVPB        10 mg/kg  61.2 kg 112.2 mL/hr over 60 Minutes Intravenous Every 8 hours 08/01/22 0910     08/01/22 0630  acyclovir (ZOVIRAX) 610 mg in dextrose 5 % 100 mL IVPB        10 mg/kg  61.2 kg 112.2 mL/hr over 60 Minutes Intravenous  Once 08/01/22 0625 08/01/22 [id number]       REVIEW OF SYSTEMS:  Const: negative fever, negative chills, negative weight loss Eyes: swelling of rt upper lid ENT: negative coryza, negative sore  throat Swollen lips Resp: negative cough, hemoptysis, dyspnea Cards: negative for chest pain, palpitations, lower extremity edema GU: negative for frequency, dysuria and hematuria GI: because of swollen lips not eating well Skin: as above Heme: negative for easy bruising and gum/nose bleeding MS: weakness Neurolo:negative for headaches, dizziness, vertigo, memory problems  Psych: negative for feelings of anxiety, depression  Endocrine: negative for thyroid, diabetes Allergy/Immunology- negative for any medication or food allergies ?  Objective:  VITALS:  BP 114/74   Pulse 71   Temp 98.8 F (37.1 C) (Oral)   Resp 12   Ht 5' 10"$  (1.778 m)   Wt 61.2 kg   SpO2 97%   BMI 19.37 kg/m   PHYSICAL EXAM: thru video General: Alert, cooperative, some  distress, .  Head: Normocephalic, without obvious abnormality, atraumatic upper lid swollen  ENT lip swollen on the rt side Face- rt side- vesicular rash with some crusting            Some erythema Neck:  symmetrical, no swelling Extremities: Rt hand- dog bite area-healing . No edema. No clubbing Skin: No rashes or lesions. Or bruising Lymph: Cervical, supraclavicular normal. Neurologic: Grossly non-focal Pertinent Labs Lab Results CBC    Component Value Date/Time   WBC 6.7 08/01/2022 0614   RBC 4.92 08/01/2022 0614   HGB 14.7 08/01/2022 0614   HCT 43.3 08/01/2022 0614   PLT 174 08/01/2022 0614   MCV 88.0 08/01/2022 0614   MCH 29.9 08/01/2022 0614   MCHC 33.9 08/01/2022 0614   RDW 11.9 08/01/2022 0614   LYMPHSABS 1.6 08/01/2022 0614   MONOABS 0.9 08/01/2022 0614   EOSABS 0.0 08/01/2022 0614   BASOSABS 0.1 08/01/2022 0614       Latest Ref Rng & Units 08/01/2022    6:14 AM 07/31/2022    7:19 AM  CMP  Glucose 70 - 99 mg/dL 104  107   BUN 6 - 20 mg/dL 19  18   Creatinine 0.61 - 1.24 mg/dL 0.79  0.94   Sodium 135 - 145 mmol/L 132  136   Potassium 3.5 - 5.1 mmol/L 3.5  3.6   Chloride 98 - 111 mmol/L 98  100    CO2 22 - 32 mmol/L 25  25   Calcium 8.9 - 10.3 mg/dL 8.6  8.9   Total Protein 6.5 - 8.1 g/dL 7.5    Total  Bilirubin 0.3 - 1.2 mg/dL 0.5    Alkaline Phos 38 - 126 U/L 43    AST 15 - 41 U/L 62    ALT 0 - 44 U/L 71        Microbiology: Blood culture sent  IMAGING RESULTS: none ? Impression/Recommendation Rt facial rash- Herpes zoster is the likely diagnosis Will do a swab for VZV PCR Agree with Iv acyclovir Some crusting- possible secondary infection Rt side of tongue numb-   Tooth ache- rt side lower premolar-r/o abscess- will give antibiotics - cefazolin + flagyl  Recent Dog bite- no infection at the site   ? ? ? ___________________________________________________ Discussed with patient, requesting provider

## 2022-08-01 NOTE — ED Triage Notes (Signed)
Pt to ED from home c/o right facial pain and rash x4 days.  Was seen here yesterday for the same.  States bumps on face have been draining.  Reports sweating but not checked temperature.  Rash has spread to scalp.

## 2022-08-01 NOTE — H&P (Addendum)
History and Physical    Terry Reyes [id number] DOB: [birth year] DOA: 08/01/2022  Referring MD/NP/PA:   PCP: Pcp, No   Patient coming from:  The patient is coming from home.  At baseline, pt is independent for most of ADL.        Chief Complaint: Facial rash  HPI: Terry Reyes is a 42 y.o. male with medical history significant of tobacco abuse, marijuana abuse, who presents with facial rash.    Patient states that his facial rash started 5 days ago, which has been progressively worsening, now involves the whole right face, right-sided and scalp.  Patient has numbness, no significant pain.  No fever or chills.  Patient does not have chest pain, cough, shortness breath.  He had some diarrhea in the past several days, which has resolved.  Currently no active diarrhea.  Denies nausea vomiting or abdominal pain.  No symptoms of UTI.  Patient has a poor dentition.  Of note, patient had dog bite to the right hand, and was seen in ED on 2/5. He said that he finished a course of Augmentin.  Patient had history of chickenpox in childhood.  Patient was seen in ED yesterday, and started oral Valtrex, no significant improvement.   Data reviewed independently and ED Course: pt was found to have WBC 6.7, GFR> 60, temperature 99.5, blood pressure 122/76, heart rate of 109, 73, 18, oxygen saturation 97% on room air.  Patient is placed on MedSurg bed for patient, consulted Dr. Delaine Lame of ID  EKG: I have personally reviewed.  Sinus rhythm, QTc 418, no ischemic change.   Review of Systems:   General: no fevers, chills, no body weight gain, fatigue HEENT: no blurry vision, hearing changes or sore throat Respiratory: no dyspnea, coughing, wheezing CV: no chest pain, no palpitations GI: no nausea, vomiting, abdominal pain, diarrhea, constipation GU: no dysuria, burning on urination, increased urinary frequency, hematuria  Ext: no leg edema Neuro: no unilateral weakness, numbness, or tingling, no  vision change or hearing loss Skin: has facial rash. MSK: No muscle spasm, no deformity, no limitation of range of movement in spin Heme: No easy bruising.  Travel history: No recent long distant travel.   Allergy: No Known Allergies  Past Medical History:  Diagnosis Date   Tobacco abuse     History reviewed. No pertinent surgical history.  Social History:  reports that he has been smoking. He has been smoking an average of 1 pack per day. He has never used smokeless tobacco. He reports that he does not currently use alcohol. He reports current drug use. Frequency: 7.00 times per week. Drug: Marijuana.  Family History:  Family History  Problem Relation Age of Onset   Hyperlipidemia Father    Seizures Father    Hypertension Father      Prior to Admission medications   Medication Sig Start Date End Date Taking? Authorizing Provider  amoxicillin-clavulanate (AUGMENTIN) 875-125 MG tablet Take 1 tablet by mouth 2 (two) times daily for 10 days. 07/24/22 08/03/22  Teodoro Spray, PA  valACYclovir (VALTREX) 1000 MG tablet Take 1 tablet (1,000 mg total) by mouth 3 (three) times daily for 7 days. 07/31/22 08/07/22  Poggi, Clarnce Flock, PA-C  Vitamins A & D (VITAMIN A & D) ointment Apply 1 application topically as needed for dry skin.    [provider]    Physical Exam: Vitals:   08/01/22 0630 08/01/22 0700 08/01/22 0714 08/01/22 0800  BP: 123/86 122/76  117/70  Pulse: 78 73  67  Resp:  18    Temp:   99.5 F (37.5 C) 98.8 F (37.1 C)  TempSrc:   Oral   SpO2: 98% 97%  96%  Weight:      Height:       General: Not in acute distress.  Dry mucous membranes HEENT: Patient has poor dentition.       Eyes: PERRL, EOMI, no scleral icterus.       ENT: No discharge from the ears and nose, no pharynx injection, no tonsillar enlargement.        Neck: No JVD, no bruit, no mass felt. Heme: No neck lymph node enlargement. Cardiac: S1/S2, RRR, No murmurs, No gallops or  rubs. Respiratory: No rales, wheezing, rhonchi or rubs. GI: Soft, nondistended, nontender, no rebound pain, no organomegaly, BS present. GU: No hematuria Ext: No pitting leg edema bilaterally. 1+DP/PT pulse bilaterally. Musculoskeletal: No joint deformities, No joint redness or warmth, no limitation of ROM in spin. Skin: Has facial rash as below, mainly in the right face, also involves right eyelid, spread to scalp, right lower face seems to have scabbed blisters                 Neuro: Alert, oriented X3, cranial nerves II-XII grossly intact, moves all extremities normally.  Psych: Patient is not psychotic, no suicidal or hemocidal ideation.  Labs on Admission: I have personally reviewed following labs and imaging studies  CBC: Recent Labs  Lab 08/01/22 0614  WBC 6.7  NEUTROABS 4.2  HGB 14.7  HCT 43.3  MCV 88.0  PLT [id number]   Basic Metabolic Panel: Recent Labs  Lab 07/31/22 0719 08/01/22 0614  NA 136 132*  K 3.6 3.5  CL 100 98  CO2 25 25  GLUCOSE 107* 104*  BUN 18 19  CREATININE 0.94 0.79  CALCIUM 8.9 8.6*   GFR: Estimated Creatinine Clearance: 104.1 mL/min (by C-G formula based on SCr of 0.79 mg/dL). Liver Function Tests: Recent Labs  Lab 08/01/22 0614  AST 62*  ALT 71*  ALKPHOS 43  BILITOT 0.5  PROT 7.5  ALBUMIN 3.8   No results for input(s): "LIPASE", "AMYLASE" in the last 168 hours. No results for input(s): "AMMONIA" in the last 168 hours. Coagulation Profile: Recent Labs  Lab 08/01/22 0614  INR 1.1   Cardiac Enzymes: No results for input(s): "CKTOTAL", "CKMB", "CKMBINDEX", "TROPONINI" in the last 168 hours. BNP (last 3 results) No results for input(s): "PROBNP" in the last 8760 hours. HbA1C: No results for input(s): "HGBA1C" in the last 72 hours. CBG: No results for input(s): "GLUCAP" in the last 168 hours. Lipid Profile: No results for input(s): "CHOL", "HDL", "LDLCALC", "TRIG", "CHOLHDL", "LDLDIRECT" in the last 72 hours. Thyroid  Function Tests: No results for input(s): "TSH", "T4TOTAL", "FREET4", "T3FREE", "THYROIDAB" in the last 72 hours. Anemia Panel: No results for input(s): "VITAMINB12", "FOLATE", "FERRITIN", "TIBC", "IRON", "RETICCTPCT" in the last 72 hours. Urine analysis: No results found for: "COLORURINE", "APPEARANCEUR", "LABSPEC", "PHURINE", "GLUCOSEU", "HGBUR", "BILIRUBINUR", "KETONESUR", "PROTEINUR", "UROBILINOGEN", "NITRITE", "LEUKOCYTESUR" Sepsis Labs: @LABRCNTIP$ (procalcitonin:4,lacticidven:4) )No results found for this or any previous visit (from the past 240 hour(s)).   Radiological Exams on Admission: No results found.    Assessment/Plan Principal Problem:   Facial rash Active Problems:   Shingles-face   Tobacco abuse   Assessment and Plan:  Facial rash due to possible shingles-face: His rash involves right eyelid and scalp, no vision change. Consulted Dr. Delaine Lame of ID.  -Placed on MedSurg bed for  completion -Start IV acyclovir -IVF: 150 cc/h normal saline -Varicella zoster PCR and aerobic culture with Gram stain per Dr. Delaine Lame  Tobacco abuse:  -Nicotine patch     DVT ppx:  SQ Lovenox  Code Status: Full code  Family Communication: not done, no family member is at bed side.    Disposition Plan:  Anticipate discharge back to previous environment  Consults called:  Dr. Theodoro Grist of ID  Admission status and Level of care: Med-Surg:  for obs    Dispo: The patient is from: Home              Anticipated d/c is to: Home              Anticipated d/c date is: 1 day              Patient currently is not medically stable to d/c.    Severity of Illness:  The appropriate patient status for this patient is OBSERVATION. Observation status is judged to be reasonable and necessary in order to provide the required intensity of service to ensure the patient's safety. The patient's presenting symptoms, physical exam findings, and initial radiographic and laboratory data in  the context of their medical condition is felt to place them at decreased risk for further clinical deterioration. Furthermore, it is anticipated that the patient will be medically stable for discharge from the hospital within 2 midnights of admission.        Date of Service 08/01/2022    Ivor Costa [facility]   If 7PM-7AM, please contact night-coverage [email or web address] 08/01/2022, 10:22 AM

## 2022-08-01 NOTE — Consult Note (Signed)
Pharmacy Antibiotic Note  Terry Reyes is a 42 y.o. male admitted on 08/01/2022 with shingles. Pharmacy has been consulted for acyclovir dosing.  Plan: Acyclovir 610 mg IV Q8H  Height: 5' 10"$  (177.8 cm) Weight: 61.2 kg (135 lb) IBW/kg (Calculated) : 73  Temp (24hrs), Avg:99.1 F (37.3 C), Min:98.8 F (37.1 C), Max:99.5 F (37.5 C)  Recent Labs  Lab 07/31/22 0719 08/01/22 0614  WBC  --  6.7  CREATININE 0.94 0.79  LATICACIDVEN  --  0.9    Estimated Creatinine Clearance: 104.1 mL/min (by C-G formula based on SCr of 0.79 mg/dL).    No Known Allergies  Antimicrobials this admission: 2/13 acyclovir >>    Dose adjustments this admission: N/A  Microbiology results: 2/13 BCx: pending   Thank you for allowing pharmacy to be a part of this patient's care.  Alison Murray 08/01/2022 9:13 AM

## 2022-08-01 NOTE — Progress Notes (Signed)
PHARMACY -  BRIEF ANTIBIOTIC NOTE   Pharmacy has received consult(s) for Acyclovir from an ED provider.  The patient's profile has been reviewed for ht/wt/allergies/indication/available labs.    One time order(s) placed for Acyclovir 610 mg IV X 1.   Further antibiotics/pharmacy consults should be ordered by admitting physician if indicated.                       Thank you, Melisha Eggleton D 08/01/2022  6:25 AM

## 2022-08-02 DIAGNOSIS — B029 Zoster without complications: Secondary | ICD-10-CM | POA: Diagnosis not present

## 2022-08-02 DIAGNOSIS — K047 Periapical abscess without sinus: Secondary | ICD-10-CM | POA: Diagnosis present

## 2022-08-02 DIAGNOSIS — R21 Rash and other nonspecific skin eruption: Secondary | ICD-10-CM | POA: Diagnosis not present

## 2022-08-02 DIAGNOSIS — B028 Zoster with other complications: Secondary | ICD-10-CM | POA: Diagnosis not present

## 2022-08-02 LAB — CBC
HCT: 37.5 % — ABNORMAL LOW (ref 39.0–52.0)
Hemoglobin: 13 g/dL (ref 13.0–17.0)
MCH: 30.2 pg (ref 26.0–34.0)
MCHC: 34.7 g/dL (ref 30.0–36.0)
MCV: 87.2 fL (ref 80.0–100.0)
Platelets: 158 10*3/uL (ref 150–400)
RBC: 4.3 MIL/uL (ref 4.22–5.81)
RDW: 11.9 % (ref 11.5–15.5)
WBC: 5.9 10*3/uL (ref 4.0–10.5)
nRBC: 0 % (ref 0.0–0.2)

## 2022-08-02 LAB — BASIC METABOLIC PANEL
Anion gap: 9 (ref 5–15)
BUN: 13 mg/dL (ref 6–20)
CO2: 25 mmol/L (ref 22–32)
Calcium: 7.9 mg/dL — ABNORMAL LOW (ref 8.9–10.3)
Chloride: 101 mmol/L (ref 98–111)
Creatinine, Ser: 0.77 mg/dL (ref 0.61–1.24)
GFR, Estimated: >60 mL/min (ref >60)
Glucose, Bld: 84 mg/dL (ref 70–99)
Potassium: 3.5 mmol/L (ref 3.5–5.1)
Sodium: 135 mmol/L (ref 135–145)

## 2022-08-02 MED ORDER — SENNOSIDES-DOCUSATE SODIUM 8.6-50 MG PO TABS
2.0000 | ORAL_TABLET | Freq: Two times a day (BID) | ORAL | Status: DC
  Administered 2022-08-02: 1 via ORAL
  Administered 2022-08-03: 2 via ORAL
  Filled 2022-08-02 (×4): qty 2

## 2022-08-02 MED ORDER — MAGNESIUM HYDROXIDE 400 MG/5ML PO SUSP
30.0000 mL | Freq: Two times a day (BID) | ORAL | Status: AC
  Administered 2022-08-02 – 2022-08-03 (×2): 30 mL via ORAL
  Filled 2022-08-02 (×2): qty 30

## 2022-08-02 MED ORDER — BUTALBITAL-APAP-CAFFEINE 50-325-40 MG PO TABS
1.0000 | ORAL_TABLET | ORAL | Status: DC | PRN
  Administered 2022-08-02 – 2022-08-04 (×4): 1 via ORAL
  Filled 2022-08-02 (×5): qty 1

## 2022-08-02 NOTE — Assessment & Plan Note (Signed)
Due to Herpes zoster.  Continue airborne/contact precaution

## 2022-08-02 NOTE — Assessment & Plan Note (Signed)
Counseled by admitting hospitalist.  Started nicotine patch

## 2022-08-02 NOTE — Assessment & Plan Note (Signed)
Checking VZV PCR, continue IV acyclovir.  He does have some crusting of lesion

## 2022-08-02 NOTE — Plan of Care (Signed)
Patient AOX4, VSS throughout shift.  All meds given on time as ordered.  Diminished lungs, IS encouraged.  Pt ambulated to bathroom with steady gait.  Pt Pain meds given for relief.  Right side of face rash going toward right ear and neck.  POC maintained, will continue to monitor.  Problem: Education: Goal: Knowledge of General Education information will improve Description: Including pain rating scale, medication(s)/side effects and non-pharmacologic comfort measures Outcome: Progressing   Problem: Health Behavior/Discharge Planning: Goal: Ability to manage health-related needs will improve Outcome: Progressing   Problem: Clinical Measurements: Goal: Ability to maintain clinical measurements within normal limits will improve Outcome: Progressing Goal: Will remain free from infection Outcome: Progressing Goal: Diagnostic test results will improve Outcome: Progressing Goal: Respiratory complications will improve Outcome: Progressing Goal: Cardiovascular complication will be avoided Outcome: Progressing   Problem: Activity: Goal: Risk for activity intolerance will decrease Outcome: Progressing   Problem: Nutrition: Goal: Adequate nutrition will be maintained Outcome: Progressing   Problem: Coping: Goal: Level of anxiety will decrease Outcome: Progressing   Problem: Elimination: Goal: Will not experience complications related to bowel motility Outcome: Progressing Goal: Will not experience complications related to urinary retention Outcome: Progressing   Problem: Pain Managment: Goal: General experience of comfort will improve Outcome: Progressing   Problem: Safety: Goal: Ability to remain free from injury will improve Outcome: Progressing   Problem: Skin Integrity: Goal: Risk for impaired skin integrity will decrease Outcome: Progressing

## 2022-08-02 NOTE — Hospital Course (Signed)
42 y.o. male with medical history significant of tobacco abuse, marijuana abuse, who presents with facial rash suspected to be herpes zoster  2/13: ID consult 2/14: Continue IV acyclovir, IV Ancef and Flagyl 2/15: Lesions getting crusted, varicella zoster PCR pending

## 2022-08-02 NOTE — Assessment & Plan Note (Signed)
Continue IV cefazolin and Flagyl per ID.  He has right lower premolar abscess/infection

## 2022-08-02 NOTE — Progress Notes (Signed)
  Progress Note   Patient: Terry Reyes [id number] DOB: May 28, 1981 DOA: 08/01/2022     0 DOS: the patient was seen and examined on 08/02/2022   Brief hospital course: 42 y.o. male with medical history significant of tobacco abuse, marijuana abuse, who presents with facial rash suspected to be herpes zoster  2/13: ID consult 2/14: Continue IV acyclovir, IV Ancef and Flagyl  Assessment and Plan: Shingles-face Due to Herpes zoster.  Continue airborne/contact precaution  Tobacco abuse Counseled by admitting hospitalist.  Started nicotine patch  Tooth abscess Continue IV cefazolin and Flagyl per ID.  He has right lower premolar abscess/infection  Herpes zoster Checking VZV PCR, continue IV acyclovir.  He does have some crusting of lesion        Subjective: Feeling somewhat better although still having pain at the oral lesion/tooth pain  Physical Exam: Vitals:   08/01/22 1415 08/01/22 2200 08/02/22 0435 08/02/22 0817  BP: 124/79 126/88 115/77 122/81  Pulse: 72 71 78 82  Resp: 16 16 15 16   Temp: 98.2 F (36.8 C) 98.8 F (37.1 C) 98.5 F (36.9 C) 98.8 F (37.1 C)  TempSrc: Oral Oral Oral Oral  SpO2: 99% 98% 97% 99%  Weight:      Height:       General: Not in acute distress.  Dry mucous membranes HEENT: Patient has poor dentition.       Eyes: PERRL, EOMI, no scleral icterus.       ENT: No discharge from the ears and nose, no pharynx injection, no tonsillar enlargement.        Neck: No JVD, no bruit, no mass felt. Heme: No neck lymph node enlargement. Cardiac: S1/S2, RRR, No murmurs, No gallops or rubs. Respiratory: No rales, wheezing, rhonchi or rubs. GI: Soft, nondistended, nontender, no rebound pain, no organomegaly, BS present. GU: No hematuria Ext: No pitting leg edema bilaterally. 1+DP/PT pulse bilaterally. Musculoskeletal: No joint deformities, No joint redness or warmth, no limitation of ROM in spin. Skin: Has facial rash as below, mainly in the right face,  also involves right eyelid, spread to scalp, right lower face seems to have scabbed blisters.  See pictures below             Data Reviewed:  There are no new results to review at this time.  Family Communication: None today  Disposition: Status is: Inpatient Remains inpatient appropriate because: Management of her pain zoster infection and tooth abscess  Planned Discharge Destination: Home   DVT prophylaxis-Lovenox Time spent: 35 minutes  Author: Max Sane, MD 08/02/2022 2:50 PM  For on call review [email or web address].

## 2022-08-03 DIAGNOSIS — K047 Periapical abscess without sinus: Secondary | ICD-10-CM | POA: Diagnosis not present

## 2022-08-03 DIAGNOSIS — Z720 Tobacco use: Secondary | ICD-10-CM | POA: Diagnosis not present

## 2022-08-03 DIAGNOSIS — B028 Zoster with other complications: Secondary | ICD-10-CM | POA: Diagnosis not present

## 2022-08-03 DIAGNOSIS — R21 Rash and other nonspecific skin eruption: Secondary | ICD-10-CM | POA: Diagnosis not present

## 2022-08-03 MED ORDER — AMOXICILLIN-POT CLAVULANATE 875-125 MG PO TABS
1.0000 | ORAL_TABLET | Freq: Two times a day (BID) | ORAL | Status: DC
  Administered 2022-08-03 – 2022-08-04 (×2): 1 via ORAL
  Filled 2022-08-03 (×2): qty 1

## 2022-08-03 MED ORDER — VALACYCLOVIR HCL 500 MG PO TABS
1000.0000 mg | ORAL_TABLET | Freq: Three times a day (TID) | ORAL | Status: DC
  Administered 2022-08-03 – 2022-08-04 (×3): 1000 mg via ORAL
  Filled 2022-08-03 (×4): qty 2

## 2022-08-03 MED ORDER — GABAPENTIN 100 MG PO CAPS
100.0000 mg | ORAL_CAPSULE | Freq: Three times a day (TID) | ORAL | Status: DC
  Administered 2022-08-03 – 2022-08-04 (×3): 100 mg via ORAL
  Filled 2022-08-03 (×3): qty 1

## 2022-08-03 NOTE — Assessment & Plan Note (Signed)
Counseled by admitting hospitalist.  Continue nicotine patch

## 2022-08-03 NOTE — Assessment & Plan Note (Signed)
Continue IV cefazolin and Flagyl per ID.  He has right lower premolar abscess/infection.  ID will reevaluate today to decide change in antibiotic for discharge planning

## 2022-08-03 NOTE — Progress Notes (Signed)
  Progress Note   Patient: Terry Reyes [id number] DOB: [birth year] DOA: 08/01/2022     1 DOS: the patient was seen and examined on 08/03/2022   Brief hospital course: 42 y.o. male with medical history significant of tobacco abuse, marijuana abuse, who presents with facial rash suspected to be herpes zoster  2/13: ID consult 2/14: Continue IV acyclovir, IV Ancef and Flagyl 2/15: Lesions getting crusted, varicella zoster PCR pending  Assessment and Plan: Shingles-face Due to Herpes zoster.  Continue airborne/contact precaution  Tobacco abuse Counseled by admitting hospitalist.  Continue nicotine patch  Tooth abscess Continue IV cefazolin and Flagyl per ID.  He has right lower premolar abscess/infection.  ID will reevaluate today to decide change in antibiotic for discharge planning  Herpes zoster Checking VZV PCR, continue IV acyclovir.  He does have some crusting of lesion.  Will start discharge planning based on ID input        Subjective: Feeling better  Physical Exam: Vitals:   08/02/22 1620 08/02/22 2015 08/03/22 0507 08/03/22 0923  BP: 113/86 123/82 120/72 108/78  Pulse: 68 76 70 (!) 55  Resp: 18  18 14   Temp:  98.5 F (36.9 C) 98.6 F (37 C) 98 F (36.7 C)  TempSrc:  Oral Oral Oral  SpO2: 99% 99% 99% 100%  Weight:      Height:       General: Not in acute distress.  Dry mucous membranes HEENT: Patient has poor dentition.       Eyes: PERRL, EOMI, no scleral icterus.       ENT: No discharge from the ears and nose, no pharynx injection, no tonsillar enlargement.        Neck: No JVD, no bruit, no mass felt. Heme: No neck lymph node enlargement. Cardiac: S1/S2, RRR, No murmurs, No gallops or rubs. Respiratory: No rales, wheezing, rhonchi or rubs. GI: Soft, nondistended, nontender, no rebound pain, no organomegaly, BS present. GU: No hematuria Ext: No pitting leg edema bilaterally. 1+DP/PT pulse bilaterally. Musculoskeletal: No joint deformities, No joint  redness or warmth, no limitation of ROM in spin. Skin: Has facial rash as below, mainly in the right face, also involves right eyelid, spread to scalp, right lower face seems to have scabbed blisters.  See pictures below         Data Reviewed:  There are no new results to review at this time.  Family Communication: None at bedside  Disposition: Status is: Inpatient Remains inpatient appropriate because: Management of herpes Shon Hale  Planned Discharge Destination: Home   DVT prophylaxis-Lovenox Time spent: 35 minutes  Author: Max Sane, MD 08/03/2022 1:31 PM  For on call review [email or web address].

## 2022-08-03 NOTE — Assessment & Plan Note (Signed)
Due to Herpes zoster.  Continue airborne/contact precaution

## 2022-08-03 NOTE — Progress Notes (Signed)
   Date of Admission:  08/01/2022      ID: Terry Reyes is a 42 y.o. male  Principal Problem:   Facial rash Active Problems:   Shingles-face   Tobacco abuse   Herpes zoster   Tooth abscess    Subjective: C/o shooting , sharp rt sided headache  Medications:   enoxaparin (LOVENOX) injection  40 mg Subcutaneous Daily   nicotine  21 mg Transdermal Daily   senna-docusate  2 tablet Oral BID    Objective: Vital signs in last 24 hours: Temp:  [98 F (36.7 C)-98.6 F (37 C)] 98 F (36.7 C) (02/15 0923) Pulse Rate:  [55-76] 55 (02/15 0923) Resp:  [14-18] 14 (02/15 0923) BP: (108-123)/(72-86) 108/78 (02/15 0923) SpO2:  [99 %-100 %] 100 % (02/15 0923)    PHYSICAL EXAM:  General: Alert, cooperative, no distress at rest , appears stated age.  Head: Normocephalic, without obvious abnormality, atraumatic. Eyes: Conjunctivae clear, anicteric sclerae. Pupils are equal ENT Nares normal. No drainage or sinus tenderness. Lips, vesicular lesions on the rt side Rt side of face- vesicular rash- some are crusting Neck: Supple, symmetrical, no adenopathy, thyroid: non tender no carotid bruit and no JVD. Back: No CVA tenderness. Lungs: Clear to auscultation bilaterally. No Wheezing or Rhonchi. No rales. Heart: Regular rate and rhythm, no murmur, rub or gallop. Abdomen: Soft, non-tender,not distended. Bowel sounds normal. No masses Extremities: atraumatic, no cyanosis. No edema. No clubbing Skin: No rashes or lesions. Or bruising Lymph: Cervical, supraclavicular normal. Neurologic: Grossly non-focal  Lab Results Recent Labs    08/01/22 0614 08/02/22 0508  WBC 6.7 5.9  HGB 14.7 13.0  HCT 43.3 37.5*  NA 132* 135  K 3.5 3.5  CL 98 101  CO2 25 25  BUN 19 13  CREATININE 0.79 0.77   Liver Panel Recent Labs    08/01/22 0614  PROT 7.5  ALBUMIN 3.8  AST 62*  ALT 71*  ALKPHOS 43  BILITOT 0.5   Sedimentation Rate Recent Labs    08/01/22 0814  ESRSEDRATE 11      Assessment/Plan: Herpes zoster on the rt side of face with no evidence of facial palsy  On IV acyclovir , can be switched to PO valtrex 1 gram TID for 7 more days Some secondary bacterial infection-currently on cefazolin + flagyl- change to PO augmentin for 7 days Discussed the management with patient and care team ID will sign off- call if needed

## 2022-08-03 NOTE — Assessment & Plan Note (Signed)
Checking VZV PCR, continue IV acyclovir.  He does have some crusting of lesion.  Will start discharge planning based on ID input

## 2022-08-04 DIAGNOSIS — B029 Zoster without complications: Secondary | ICD-10-CM | POA: Diagnosis not present

## 2022-08-04 DIAGNOSIS — B028 Zoster with other complications: Secondary | ICD-10-CM | POA: Diagnosis not present

## 2022-08-04 DIAGNOSIS — R21 Rash and other nonspecific skin eruption: Secondary | ICD-10-CM | POA: Diagnosis not present

## 2022-08-04 DIAGNOSIS — K047 Periapical abscess without sinus: Secondary | ICD-10-CM | POA: Diagnosis not present

## 2022-08-04 LAB — AEROBIC CULTURE W GRAM STAIN (SUPERFICIAL SPECIMEN): Gram Stain: NONE SEEN

## 2022-08-04 LAB — VARICELLA-ZOSTER BY PCR: Varicella-Zoster, PCR: POSITIVE — AB

## 2022-08-04 MED ORDER — GABAPENTIN 100 MG PO CAPS: 200.0000 mg | ORAL_CAPSULE | Freq: Three times a day (TID) | ORAL | Status: DC

## 2022-08-04 MED ORDER — GABAPENTIN 100 MG PO CAPS: 200.0000 mg | ORAL_CAPSULE | Freq: Three times a day (TID) | ORAL | 0 refills | Status: DC

## 2022-08-04 MED ORDER — CARBAMIDE PEROXIDE 6.5 % OT SOLN: 3.0000 [drp] | Freq: Two times a day (BID) | OTIC | 0 refills | Status: AC

## 2022-08-04 MED ORDER — CIPRO HC 0.2-1 % OT SUSP: 2.0000 [drp] | Freq: Two times a day (BID) | OTIC | 0 refills | Status: AC

## 2022-08-04 MED ORDER — HYDROCODONE-ACETAMINOPHEN 5-325 MG PO TABS: 1.0000 | ORAL_TABLET | Freq: Two times a day (BID) | ORAL | 0 refills | Status: AC | PRN

## 2022-08-05 NOTE — Discharge Summary (Signed)
Physician Discharge Summary   Patient: Terry Reyes MRN: [id number] DOB: Feb 20, 1981  Admit date:     08/01/2022  Discharge date: 08/04/2022  Discharge Physician: Max Sane   PCP: Pcp, No   Recommendations at discharge:   Follow-up with outpatient providers as requested  Discharge Diagnoses: Principal Problem:   Facial rash Active Problems:   Shingles-face   Tobacco abuse   Herpes zoster   Tooth abscess  Hospital Course: 42 y.o. male with medical history significant of tobacco abuse, marijuana abuse, who presents with facial rash suspected to be herpes zoster  2/13: ID consult 2/14: Continue IV acyclovir, IV Ancef and Flagyl 2/15: Lesions getting crusted, varicella zoster PCR pending  Assessment and Plan:  Herpes zoster on the right side of face with no evidence of facial palsy Improved on IV acyclovir.  Transition to oral Valtrex for 7 more days at discharge  Tobacco abuse Counseled   Tooth abscess Treated with IV cefazolin and Flagyl while in the hospital.  Outpatient follow-up with dentist       Consultants: Infectious disease Disposition: Home Diet recommendation:  Discharge Diet Orders (From admission, onward)     Start     Ordered   08/04/22 0000  Diet - low sodium heart healthy        08/04/22 1058           Carb modified diet DISCHARGE MEDICATION: Allergies as of 08/04/2022   No Known Allergies      Medication List     STOP taking these medications    amoxicillin-clavulanate 875-125 MG tablet Commonly known as: AUGMENTIN   clindamycin 300 MG capsule Commonly known as: CLEOCIN       TAKE these medications    carbamide peroxide 6.5 % OTIC solution Commonly known as: DEBROX Place 3 drops into the right ear 2 (two) times daily for 4 days.   Cipro HC OTIC suspension Generic drug: ciprofloxacin-hydrocortisone Place 2 drops into the right ear 2 (two) times daily for 5 days.   gabapentin 100 MG capsule Commonly known as:  NEURONTIN Take 2 capsules (200 mg total) by mouth 3 (three) times daily for 10 days.   HYDROcodone-acetaminophen 5-325 MG tablet Commonly known as: NORCO/VICODIN Take 1 tablet by mouth 2 (two) times daily as needed for up to 7 days for moderate pain.   valACYclovir 1000 MG tablet Commonly known as: Valtrex Take 1 tablet (1,000 mg total) by mouth 3 (three) times daily for 7 days.   vitamin A & D ointment Apply 1 application topically as needed for dry skin.        Follow-up Information     Tsosie Billing, MD. Schedule an appointment as soon as possible for a visit in 1 week(s).   Specialty: Infectious Diseases Why: Patient to make own follow up appt  [facility] Discharge F/UP Contact information: [place] [address] [phone]         [facility] Schedule an appointment as soon as possible for a visit in 1 week(s).   Why: Patient to make own follow up appt [facility] Discharge F/UP Contact information: [place] [address] [phone]                Discharge Exam: Danley Danker Weights   08/01/22 0358  Weight: 61.2 kg   General: Not in acute distress.  Dry mucous membranes HEENT: Patient has poor dentition.       Eyes: PERRL, EOMI, no  scleral icterus.       ENT: No discharge from the ears and nose, no pharynx injection, no tonsillar enlargement.        Neck: No JVD, no bruit, no mass felt. Heme: No neck lymph node enlargement. Cardiac: S1/S2, RRR, No murmurs, No gallops or rubs. Respiratory: No rales, wheezing, rhonchi or rubs. GI: Soft, nondistended, nontender, no rebound pain, no organomegaly, BS present. GU: No hematuria Ext: No pitting leg edema bilaterally. 1+DP/PT pulse bilaterally. Musculoskeletal: No joint deformities, No joint redness or warmth, no limitation of ROM in spin. Skin: Has facial rash as below, mainly in the right face, also involves right eyelid, spread to scalp, right  lower face seems to have scabbed blisters   Condition at discharge: fair  The results of significant diagnostics from this hospitalization (including imaging, microbiology, ancillary and laboratory) are listed below for reference.   Imaging Studies: DG Hand Complete Right  Result Date: 07/24/2022 CLINICAL DATA:  Status post dog bite. EXAM: RIGHT HAND - COMPLETE 3+ VIEW COMPARISON:  None Available. FINDINGS: There is no evidence of fracture or dislocation. There is no evidence of arthropathy or other focal bone abnormality. Soft tissues are unremarkable. IMPRESSION: Negative. Electronically Signed   By: Virgina Norfolk M.D.   On: 07/24/2022 22:56    Microbiology: Results for orders placed or performed during the hospital encounter of 08/01/22  Blood Culture (routine x 2)     Status: None (Preliminary result)   Collection Time: 08/01/22  6:14 AM   Specimen: BLOOD  Result Value Ref Range Status   Specimen Description BLOOD BLOOD RIGHT ARM  Final   Special Requests   Final    BOTTLES DRAWN AEROBIC AND ANAEROBIC Blood Culture results may not be optimal due to an excessive volume of blood received in culture bottles   Culture   Final    NO GROWTH 4 DAYS Performed at [facility], [address], [place], [place] [address]    Report Status PENDING  Incomplete  Blood Culture (routine x 2)     Status: None (Preliminary result)   Collection Time: 08/01/22  6:19 AM   Specimen: BLOOD  Result Value Ref Range Status   Specimen Description BLOOD BLOOD RIGHT ARM  Final   Special Requests   Final    BOTTLES DRAWN AEROBIC AND ANAEROBIC Blood Culture results may not be optimal due to an excessive volume of blood received in culture bottles   Culture   Final    NO GROWTH 4 DAYS Performed at [facility], [address] [place], [place] [address]    Report Status PENDING  Incomplete  Varicella-zoster by PCR     Status: Abnormal   Collection Time: 08/01/22  2:15 PM   Specimen:  Sterile Swab  Result Value Ref Range Status   Varicella-Zoster, PCR Positive (A) Negative Final    Comment: (NOTE) Varicella Zoster Virus DNA detected. This test was developed and its performance characteristics determined by [facility].  It has not been cleared or approved by the Food and Drug Administration.  The FDA has determined that such clearance or approval is not necessary. Performed At: [facility] [place], [place] [id number] Rush Farmer MD [id number]   Aerobic Culture w Gram Stain (superficial specimen)     Status: None   Collection Time: 08/01/22  2:15 PM   Specimen: Face; Wound  Result Value Ref Range Status   Specimen Description   Final    FACE Performed at  [facility], [address], [place], [place] [address]    Special Requests   Final    NONE Performed at [place], [place] [address]    Gram Stain   Final    NO WBC SEEN RARE GRAM POSITIVE COCCI IN PAIRS RARE BUDDING YEAST SEEN RARE GRAM POSITIVE RODS    Culture   Final    FEW ESCHERICHIA COLI ABUNDANT STAPHYLOCOCCUS EPIDERMIDIS CALL MICROBIOLOGY LAB IF SENSITIVITIES ARE REQUIRED. Performed at [facility], [place] [address], [place], [place] [address]    Report Status 08/04/2022 FINAL  Final   Organism ID, Bacteria ESCHERICHIA COLI  Final      Susceptibility   Escherichia coli - MIC*    AMPICILLIN <=2 SENSITIVE Sensitive     CEFEPIME <=0.12 SENSITIVE Sensitive     CEFTAZIDIME <=1 SENSITIVE Sensitive     CEFTRIAXONE <=0.25 SENSITIVE Sensitive     CIPROFLOXACIN <=0.25 SENSITIVE Sensitive     GENTAMICIN <=1 SENSITIVE Sensitive     IMIPENEM <=0.25 SENSITIVE Sensitive     TRIMETH/SULFA <=20 SENSITIVE Sensitive     AMPICILLIN/SULBACTAM <=2 SENSITIVE Sensitive     PIP/TAZO <=4 SENSITIVE Sensitive     * FEW ESCHERICHIA COLI    Labs: CBC: Recent Labs  Lab 08/01/22 0614 08/02/22 0508  WBC 6.7 5.9  NEUTROABS 4.2  --    HGB 14.7 13.0  HCT 43.3 37.5*  MCV 88.0 87.2  PLT 174 0000000   Basic Metabolic Panel: Recent Labs  Lab 07/31/22 0719 08/01/22 0614 08/02/22 0508  NA 136 132* 135  K 3.6 3.5 3.5  CL 100 98 101  CO2 25 25 25  $ GLUCOSE 107* 104* 84  BUN 18 19 13  $ CREATININE 0.94 0.79 0.77  CALCIUM 8.9 8.6* 7.9*   Liver Function Tests: Recent Labs  Lab 08/01/22 0614  AST 62*  ALT 71*  ALKPHOS 43  BILITOT 0.5  PROT 7.5  ALBUMIN 3.8   CBG: No results for input(s): "GLUCAP" in the last 168 hours.  Discharge time spent: greater than 30 minutes.  Signed: Max Sane, MD [facility] 08/05/2022

## 2022-08-06 LAB — CULTURE, BLOOD (ROUTINE X 2)
Culture: NO GROWTH
Culture: NO GROWTH

## 2022-08-15 ENCOUNTER — Ambulatory Visit: Payer: PRIVATE HEALTH INSURANCE | Attending: Infectious Diseases | Admitting: Infectious Diseases

## 2022-08-15 ENCOUNTER — Encounter: Payer: Self-pay | Admitting: Infectious Diseases

## 2022-08-15 VITALS — BP 115/74 | HR 97 | Temp 96.6°F | Ht 70.0 in | Wt 118.0 lb

## 2022-08-15 DIAGNOSIS — F17210 Nicotine dependence, cigarettes, uncomplicated: Secondary | ICD-10-CM | POA: Diagnosis not present

## 2022-08-15 DIAGNOSIS — B029 Zoster without complications: Secondary | ICD-10-CM | POA: Diagnosis present

## 2022-08-15 MED ORDER — GABAPENTIN 300 MG PO CAPS: 300.0000 mg | ORAL_CAPSULE | Freq: Two times a day (BID) | ORAL | 0 refills | Status: DC

## 2022-08-15 NOTE — Progress Notes (Signed)
NAME: Terry Reyes  DOB: 04/07/1981  MRN: [id number]  Date/Time: 08/15/2022 9:22 AM  Subjective:    ? Terry Reyes is a 42 y.o. was recently in the hospital for herpes zoster involving the rt side of the face The lesions have all healed but he has severe pain rt side of the face- now a constant 2/3 and allo f a sudden he will have a sharp pain HE also has pain inside his rt ear and has been using q tip with hydrogen peroxide- He also had tooth abscess and is seeing dentist this Thursday. He thinks all this started after he tried to quit smoking by Vaping and feels the chemical in the vape has burnt inside his mouth and then the lesions started He has ran out of gabapentin/hydrocodone     Past Medical History:  Diagnosis Date   Tobacco abuse     No past surgical history on file.  Social History   Socioeconomic History   Marital status: Single    Spouse name: Not on file   Number of children: Not on file   Years of education: Not on file   Highest education level: Not on file  Occupational History   Not on file  Tobacco Use   Smoking status: Every Day    Packs/day: 1.00    Types: Cigarettes   Smokeless tobacco: Never  Substance and Sexual Activity   Alcohol use: Not Currently   Drug use: Yes    Frequency: 7.0 times per week    Types: Marijuana   Sexual activity: Not Currently  Other Topics Concern   Not on file  Social History Narrative   Not on file   Social Determinants of Health   Financial Resource Strain: Not on file  Food Insecurity: Not on file  Transportation Needs: Not on file  Physical Activity: Not on file  Stress: Not on file  Social Connections: Not on file  Intimate Partner Violence: Not on file    Family History  Problem Relation Age of Onset   Hyperlipidemia Father    Seizures Father    Hypertension Father    No Known Allergies I? Current Outpatient Medications  Medication Sig Dispense Refill   Vitamins A & D (VITAMIN A & D) ointment  Apply 1 application topically as needed for dry skin.     No current facility-administered medications for this visit.     Abtx:  Anti-infectives (From admission, onward)    None       REVIEW OF SYSTEMS:  Const: negative fever, negative chills, negative weight loss Eyes: negative diplopia or visual changes, negative eye pain ENT: negative coryza, negative sore throat Resp: negative cough, hemoptysis, dyspnea Cards: negative for chest pain, palpitations, lower extremity edema GU: negative for frequency, dysuria and hematuria GI: Negative for abdominal pain, diarrhea, bleeding, constipation Skin: negative for rash and pruritus Heme: negative for easy bruising and gum/nose bleeding MS: negative for myalgias, arthralgias, back pain and muscle weakness Neurolo:neuralgic pain Psych: negative for feelings of anxiety, depression  Endocrine: negative for thyroid, diabetes Allergy/Immunology- negative for any medication or food allergies  Objective:  VITALS:  BP 115/74   Pulse 97   Temp (!) 96.6 F (35.9 C) (Temporal)   Ht '5\' 10"'$  (1.778 m)   Wt 118 lb (53.5 kg)   BMI 16.93 kg/m   PHYSICAL EXAM:  General: Alert, cooperative, no distress, appears stated age.  Rt side of the face- rash has resolved completely. 08/15/22  08/01/22     Head: Normocephalic, without obvious abnormality, atraumatic. Eyes: Conjunctivae clear, anicteric sclerae. Pupils are equal ENT rt ear- some trauma. Lips, mucosa, and tongue normal. No Thrush Neck: Supple, symmetrical, no adenopathy, thyroid: non tender no carotid bruit and no JVD. Back: No CVA tenderness. Lungs: Clear to auscultation bilaterally. No Wheezing or Rhonchi. No rales. Heart: Regular rate and rhythm, no murmur, rub or gallop. Abdomen: Soft, non-tender,not distended. Bowel sounds normal. No masses Extremities: atraumatic, no cyanosis. No edema. No clubbing Skin: No rashes or lesions. Or bruising Lymph: Cervical, supraclavicular  normal. Neurologic: Grossly non-focal Pertinent Labs Lab Results CBC    Component Value Date/Time   WBC 5.9 08/02/2022 0508   RBC 4.30 08/02/2022 0508   HGB 13.0 08/02/2022 0508   HCT 37.5 (L) 08/02/2022 0508   PLT 158 08/02/2022 0508   MCV 87.2 08/02/2022 0508   MCH 30.2 08/02/2022 0508   MCHC 34.7 08/02/2022 0508   RDW 11.9 08/02/2022 0508   LYMPHSABS 1.6 08/01/2022 0614   MONOABS 0.9 08/01/2022 0614   EOSABS 0.0 08/01/2022 0614   BASOSABS 0.1 08/01/2022 0614       Latest Ref Rng & Units 08/02/2022    5:08 AM 08/01/2022    6:14 AM 07/31/2022    7:19 AM  CMP  Glucose 70 - 99 mg/dL 84  104  107   BUN 6 - 20 mg/dL '13  19  18   '$ Creatinine 0.61 - 1.24 mg/dL 0.77  0.79  0.94   Sodium 135 - 145 mmol/L 135  132  136   Potassium 3.5 - 5.1 mmol/L 3.5  3.5  3.6   Chloride 98 - 111 mmol/L 101  98  100   CO2 22 - 32 mmol/L '25  25  25   '$ Calcium 8.9 - 10.3 mg/dL 7.9  8.6  8.9   Total Protein 6.5 - 8.1 g/dL  7.5    Total Bilirubin 0.3 - 1.2 mg/dL  0.5    Alkaline Phos 38 - 126 U/L  43    AST 15 - 41 U/L  62    ALT 0 - 44 U/L  71      ? Impression/Recommendation ? ?HErpes zoster involving the rt trigeminal??  The rash has resolved completley and he has completed valtrex  but he has post herpetic neuralgia- This can last as long as 3-6 months -He will continue to take gabapentin He does not have PCP and uses [facility]- Told him to enroll with PCP- HE spoke with his insurance and he has a POP plan- and he will have to find a PCP  Rt ear pain-is part of the neuralgia - told him not to use q tip or hydrogen peroxide- he will see ENT if it gets worse  Has a dental appt to r/o dental abscess   Follow PRN?  Note:  This document was prepared using Dragon voice recognition software and may include unintentional dictation errors.

## 2022-11-07 NOTE — Progress Notes (Unsigned)
I,Terry Reyes,acting as a Neurosurgeon for Terry Kindle, Terry Reyes.,have documented all relevant documentation on the behalf of Terry Kindle, Terry Reyes,as directed by  Terry Kindle, Terry Reyes while in the presence of Terry Kindle, Terry Reyes.   New patient visit  Patient: Terry Reyes   DOB: 1980-08-27   42 y.o. Male  MRN: [id number] Visit Date: 11/08/2022  Today's healthcare provider: Jacky Kindle, Terry Reyes  Patient presents for new patient visit to establish care.  Introduced to Publishing rights manager role and practice setting.  All questions answered.  Discussed provider/patient relationship and expectations.  No chief complaint on file.  Subjective    Terry Reyes is a 42 y.o. male who presents today as a new patient to establish care.  HPI   Past Medical History:  Diagnosis Date   Depression    Tobacco abuse    History reviewed. No pertinent surgical history. Family Status  Relation Name Status   Mother  Alive   Father  Alive   Brother  Alive   Family History  Problem Relation Age of Onset   CVA Mother    Heart attack Mother    Hyperlipidemia Father    Seizures Father    Hypertension Father    Social History   Socioeconomic History   Marital status: Single    Spouse name: Not on file   Number of children: Not on file   Years of education: Not on file   Highest education level: Not on file  Occupational History   Not on file  Tobacco Use   Smoking status: Every Day    Packs/day: 1.5    Types: Cigarettes    Start date: 02/21/1994   Smokeless tobacco: Never  Vaping Use   Vaping Use: Former   Start date: 06/19/2022   Quit date: 08/16/2022  Substance and Sexual Activity   Alcohol use: Not Currently   Drug use: Yes    Frequency: 7.0 times per week    Types: Marijuana   Sexual activity: Not Currently  Other Topics Concern   Not on file  Social History Narrative   Not on file   Social Determinants of Health   Financial Resource Strain: Not on file  Food Insecurity: Not on file   Transportation Needs: Not on file  Physical Activity: Not on file  Stress: Not on file  Social Connections: Not on file   Outpatient Medications Prior to Visit  Medication Sig   [DISCONTINUED] gabapentin (NEURONTIN) 300 MG capsule Take 1 capsule (300 mg total) by mouth 2 (two) times daily. (Patient not taking: Reported on 11/08/2022)   [DISCONTINUED] Vitamins A & D (VITAMIN A & D) ointment Apply 1 application topically as needed for dry skin. (Patient not taking: Reported on 11/08/2022)   No facility-administered medications prior to visit.   No Known Allergies  Immunization History  Administered Date(s) Administered   Tdap 07/24/2022    Health Maintenance  Topic Date Due   COVID-19 Vaccine (1) Never done   Hepatitis C Screening  Never done   INFLUENZA VACCINE  01/18/2023   DTaP/Tdap/Td (2 - Td or Tdap) 07/24/2032   HIV Screening  Completed   HPV VACCINES  Aged Out    Patient Care Team: Terry Kindle, Terry Reyes as PCP - General (Family Medicine)  Review of Systems  Constitutional:  Positive for appetite change and unexpected weight change.  HENT:  Positive for dental problem, ear discharge, ear pain, facial swelling and mouth sores.  Gastrointestinal:  Positive for nausea.  Skin:  Positive for rash.  Neurological:  Positive for dizziness, facial asymmetry, weakness, light-headedness and headaches.  Psychiatric/Behavioral:  Positive for sleep disturbance.     Last metabolic panel Lab Results  Component Value Date   GLUCOSE 84 08/02/2022   NA 135 08/02/2022   K 3.5 08/02/2022   CL 101 08/02/2022   CO2 25 08/02/2022   BUN 13 08/02/2022   CREATININE 0.77 08/02/2022   GFRNONAA >60 08/02/2022   CALCIUM 7.9 (L) 08/02/2022   PROT 7.5 08/01/2022   ALBUMIN 3.8 08/01/2022   BILITOT 0.5 08/01/2022   ALKPHOS 43 08/01/2022   AST 62 (H) 08/01/2022   ALT 71 (H) 08/01/2022   ANIONGAP 9 08/02/2022       Objective    BP 106/68 (BP Location: Right Arm, Patient Position:  Sitting, Cuff Size: Normal)   Pulse (!) 57   Ht 5\' 10"  (1.778 m)   Wt 118 lb 11.2 oz (53.8 kg)   SpO2 98%   BMI 17.03 kg/m  BP Readings from Last 3 Encounters:  11/08/22 106/68  08/15/22 115/74  08/04/22 110/74   Wt Readings from Last 3 Encounters:  11/08/22 118 lb 11.2 oz (53.8 kg)  08/15/22 118 lb (53.5 kg)  08/01/22 135 lb (61.2 kg)   SpO2 Readings from Last 3 Encounters:  11/08/22 98%  08/04/22 99%  07/31/22 97%      Physical Exam Vitals and nursing note reviewed.  Constitutional:      General: He is awake. He is not in acute distress.    Appearance: Normal appearance. He is well-developed, well-groomed and underweight. He is not ill-appearing, toxic-appearing or diaphoretic.  HENT:     Head: Normocephalic and atraumatic.     Jaw: There is normal jaw occlusion. No trismus, tenderness, swelling or pain on movement.     Salivary Glands: Right salivary gland is not diffusely enlarged or tender. Left salivary gland is not diffusely enlarged or tender.     Right Ear: Hearing, tympanic membrane, ear canal and external ear normal. There is no impacted cerumen.     Left Ear: Hearing, tympanic membrane, ear canal and external ear normal. There is no impacted cerumen.     Nose: Nose normal. No congestion or rhinorrhea.     Right Turbinates: Not enlarged, swollen or pale.     Left Turbinates: Not enlarged, swollen or pale.     Right Sinus: No maxillary sinus tenderness or frontal sinus tenderness.     Left Sinus: No maxillary sinus tenderness or frontal sinus tenderness.     Mouth/Throat:     Lips: Pink.     Mouth: Mucous membranes are moist. No injury, lacerations, oral lesions or angioedema.     Pharynx: Oropharynx is clear. Uvula midline. No pharyngeal swelling, oropharyngeal exudate or posterior oropharyngeal erythema.     Tonsils: No tonsillar exudate or tonsillar abscesses.  Eyes:     General: Lids are normal. Vision grossly intact. Gaze aligned appropriately.         Right eye: No discharge.        Left eye: No discharge.     Extraocular Movements: Extraocular movements intact.     Conjunctiva/sclera: Conjunctivae normal.     Pupils: Pupils are equal, round, and reactive to light.  Neck:     Thyroid: No thyroid mass, thyromegaly or thyroid tenderness.     Vascular: No carotid bruit.     Trachea: Trachea normal. No tracheal tenderness.  Cardiovascular:  Rate and Rhythm: Regular rhythm. Bradycardia present.     Pulses: Normal pulses.          Carotid pulses are 2+ on the right side and 2+ on the left side.      Radial pulses are 2+ on the right side and 2+ on the left side.       Femoral pulses are 2+ on the right side and 2+ on the left side.      Popliteal pulses are 2+ on the right side and 2+ on the left side.       Dorsalis pedis pulses are 2+ on the right side and 2+ on the left side.       Posterior tibial pulses are 2+ on the right side and 2+ on the left side.     Heart sounds: Normal heart sounds, S1 normal and S2 normal. No murmur heard.    No friction rub. No gallop.  Pulmonary:     Effort: Pulmonary effort is normal. No respiratory distress.     Breath sounds: Normal breath sounds and air entry. No stridor. No wheezing, rhonchi or rales.  Chest:     Chest wall: No tenderness.  Abdominal:     General: Abdomen is flat. Bowel sounds are normal. There is no distension.     Palpations: Abdomen is soft. There is no mass.     Tenderness: There is no abdominal tenderness. There is no guarding or rebound.     Hernia: No hernia is present.  Genitourinary:    Comments: Exam deferred; denies complaints Musculoskeletal:        General: No swelling, deformity or signs of injury. Normal range of motion.     Cervical back: Normal range of motion and neck supple. Tenderness present. No rigidity.     Right lower leg: No edema.     Left lower leg: No edema.  Lymphadenopathy:     Cervical: No cervical adenopathy.     Right cervical: No  superficial, deep or posterior cervical adenopathy.    Left cervical: No superficial, deep or posterior cervical adenopathy.  Skin:    General: Skin is warm and dry.     Capillary Refill: Capillary refill takes less than 2 seconds.     Coloration: Skin is not jaundiced or pale.     Findings: No bruising, erythema, lesion or rash.  Neurological:     General: No focal deficit present.     Mental Status: He is alert and oriented to person, place, and time. Mental status is at baseline.     GCS: GCS eye subscore is 4. GCS verbal subscore is 5. GCS motor subscore is 6.     Sensory: Sensation is intact. No sensory deficit.     Motor: Motor function is intact. No weakness.     Coordination: Coordination is intact.     Gait: Gait is intact.  Psychiatric:        Attention and Perception: Attention and perception normal.        Mood and Affect: Mood and affect normal.        Speech: Speech normal.        Behavior: Behavior normal. Behavior is cooperative.        Thought Content: Thought content normal.        Cognition and Memory: Cognition normal.        Judgment: Judgment normal.    Depression Screen    11/08/2022    9:43 AM  PHQ  2/9 Scores  PHQ - 2 Score 4  PHQ- 9 Score 10   No results found for any visits on 11/08/22.  Assessment & Plan      Problem List Items Addressed This Visit       Immune and Lymphatic   Enlarged lymph node in neck    Acute, variable size L neck Referral for [place] to assist with next steps Will check labs as well given ongoing fatigue, underweight status      Relevant Orders   US Soft Tissue Head/Neck (NON-THYROID)     Other   Annual physical exam    Due for vision Discussed colon cancer screening at 44 Things to do to keep yourself healthy  - Exercise at least 30-45 minutes a day, 3-4 days a week.  - Eat a low-fat diet with lots of fruits and vegetables, up to 7-9 servings per day.  - Seatbelts can save your life. Wear them always.  - Smoke  detectors on every level of your home, check batteries every year.  - Eye Doctor - have an eye exam every 1-2 years  - Safe sex - if you may be exposed to STDs, use a condom.  - Alcohol -  If you drink, do it moderately, less than 2 drinks per day.  - Health Care Power of Attorney. Choose someone to speak for you if you are not able.  - Depression is common in our stressful world.If you're feeling down or losing interest in things you normally enjoy, please come in for a visit.  - Violence - If anyone is threatening or hurting you, please call immediately.       Relevant Orders   CBC with Differential/Platelet   Comprehensive Metabolic Panel (CMET)   TSH + free T4   Lipid panel   Depression - Primary    Acute, mild per pt report Will start remeron to assist with weight gain and mood given ongoing recent health concerns  Encourage f/u in 6-8 weeks       Relevant Medications   mirtazapine (REMERON SOL-TAB) 15 MG disintegrating tablet   Encounter for hepatitis C screening test for low risk patient   Relevant Orders   Hepatitis C Antibody   Encounter for prostate cancer screening   Relevant Orders   PSA   Encounter for screening for HIV   Relevant Orders   HIV antibody (with reflex)   Encounter to establish care    Recent shingles concern- has not seen a PCP in years  Single Has 61 year old daughter, shared custody, she lives in [place] Both mother and brother with drug use/misuse Works at [employer] x4 years       Family history of ASCVD    Mother with recent stroke and MI      Relevant Orders   Lipid panel   History of shingles    Improving; with associated tooth pain and follow up from ID- was on gaba; has since stopped use Recommend vaccination at 42 years old      Routine screening for STI (sexually transmitted infection)    Strong family hx of drug use and pallor/underweight with neck lymph node       Relevant Orders   Hepatitis C Antibody   HIV antibody  (with reflex)   QuantiFERON-TB Gold Plus   RPR   Tobacco dependence    Chronic, reports tobacco use of cigarettes/e-cigarettes since 1995; reports 1-1.5 ppd and previous use of vape when trying to  come off of cigarettes       Underweight    Chronic, improved per pt report In association with recent shingles and tooth abscess; s/p extraction and plan for lower plate Body mass index is 17.03 kg/m.        Relevant Medications   mirtazapine (REMERON SOL-TAB) 15 MG disintegrating tablet   Other Relevant Orders   US Soft Tissue Head/Neck (NON-THYROID)   Return in about 6 weeks (around 12/20/2022) for anxiety and depression.    Terry Merl, Terry Reyes, have reviewed all documentation for this visit. The documentation on 11/08/22 for the exam, diagnosis, procedures, and orders are all accurate and complete.  Terry Kindle, Terry Reyes  [facility] [phone] (phone) [phone] (fax)  [facility]

## 2022-11-08 ENCOUNTER — Encounter: Payer: Self-pay | Admitting: Family Medicine

## 2022-11-08 ENCOUNTER — Ambulatory Visit (INDEPENDENT_AMBULATORY_CARE_PROVIDER_SITE_OTHER): Payer: PRIVATE HEALTH INSURANCE | Admitting: Family Medicine

## 2022-11-08 VITALS — BP 106/68 | HR 57 | Ht 70.0 in | Wt 118.7 lb

## 2022-11-08 DIAGNOSIS — F32A Depression, unspecified: Secondary | ICD-10-CM | POA: Insufficient documentation

## 2022-11-08 DIAGNOSIS — Z7689 Persons encountering health services in other specified circumstances: Secondary | ICD-10-CM

## 2022-11-08 DIAGNOSIS — R636 Underweight: Secondary | ICD-10-CM | POA: Insufficient documentation

## 2022-11-08 DIAGNOSIS — Z125 Encounter for screening for malignant neoplasm of prostate: Secondary | ICD-10-CM | POA: Insufficient documentation

## 2022-11-08 DIAGNOSIS — F320 Major depressive disorder, single episode, mild: Secondary | ICD-10-CM | POA: Diagnosis not present

## 2022-11-08 DIAGNOSIS — Z113 Encounter for screening for infections with a predominantly sexual mode of transmission: Secondary | ICD-10-CM | POA: Insufficient documentation

## 2022-11-08 DIAGNOSIS — Z1159 Encounter for screening for other viral diseases: Secondary | ICD-10-CM | POA: Insufficient documentation

## 2022-11-08 DIAGNOSIS — F17210 Nicotine dependence, cigarettes, uncomplicated: Secondary | ICD-10-CM | POA: Diagnosis not present

## 2022-11-08 DIAGNOSIS — Z8619 Personal history of other infectious and parasitic diseases: Secondary | ICD-10-CM | POA: Insufficient documentation

## 2022-11-08 DIAGNOSIS — Z0000 Encounter for general adult medical examination without abnormal findings: Secondary | ICD-10-CM | POA: Diagnosis not present

## 2022-11-08 DIAGNOSIS — Z8249 Family history of ischemic heart disease and other diseases of the circulatory system: Secondary | ICD-10-CM

## 2022-11-08 DIAGNOSIS — Z114 Encounter for screening for human immunodeficiency virus [HIV]: Secondary | ICD-10-CM | POA: Insufficient documentation

## 2022-11-08 DIAGNOSIS — R590 Localized enlarged lymph nodes: Secondary | ICD-10-CM | POA: Diagnosis not present

## 2022-11-08 DIAGNOSIS — F17200 Nicotine dependence, unspecified, uncomplicated: Secondary | ICD-10-CM | POA: Insufficient documentation

## 2022-11-08 MED ORDER — MIRTAZAPINE 15 MG PO TBDP: 15.0000 mg | ORAL_TABLET | Freq: Every day | ORAL | 1 refills | Status: AC

## 2022-11-08 NOTE — Assessment & Plan Note (Signed)
Recent shingles concern- has not seen a PCP in years  Single Has 43 year old daughter, shared custody, she lives in [place] Both mother and brother with drug use/misuse Works at [employer] x4 years

## 2022-11-08 NOTE — Assessment & Plan Note (Signed)
Chronic, improved per pt report In association with recent shingles and tooth abscess; s/p extraction and plan for lower plate Body mass index is 17.03 kg/m.

## 2022-11-08 NOTE — Assessment & Plan Note (Signed)
Mother with recent stroke and MI

## 2022-11-08 NOTE — Assessment & Plan Note (Signed)
Acute, variable size L neck Referral for [place] to assist with next steps Will check labs as well given ongoing fatigue, underweight status

## 2022-11-08 NOTE — Assessment & Plan Note (Signed)
Acute, mild per pt report Will start remeron to assist with weight gain and mood given ongoing recent health concerns  Encourage f/u in 6-8 weeks

## 2022-11-08 NOTE — Assessment & Plan Note (Signed)
Due for vision Discussed colon cancer screening at 44 Things to do to keep yourself healthy  - Exercise at least 30-45 minutes a day, 3-4 days a week.  - Eat a low-fat diet with lots of fruits and vegetables, up to 7-9 servings per day.  - Seatbelts can save your life. Wear them always.  - Smoke detectors on every level of your home, check batteries every year.  - Eye Doctor - have an eye exam every 1-2 years  - Safe sex - if you may be exposed to STDs, use a condom.  - Alcohol -  If you drink, do it moderately, less than 2 drinks per day.  - Health Care Power of Attorney. Choose someone to speak for you if you are not able.  - Depression is common in our stressful world.If you're feeling down or losing interest in things you normally enjoy, please come in for a visit.  - Violence - If anyone is threatening or hurting you, please call immediately.

## 2022-11-08 NOTE — Assessment & Plan Note (Signed)
Strong family hx of drug use and pallor/underweight with neck lymph node

## 2022-11-08 NOTE — Assessment & Plan Note (Signed)
Improving; with associated tooth pain and follow up from ID- was on gaba; has since stopped use Recommend vaccination at 42 years old

## 2022-11-08 NOTE — Assessment & Plan Note (Signed)
Chronic, reports tobacco use of cigarettes/e-cigarettes since 1995; reports 1-1.5 ppd and previous use of vape when trying to come off of cigarettes

## 2022-11-09 LAB — LIPID PANEL
Chol/HDL Ratio: 4.7 ratio (ref 0.0–5.0)
Cholesterol, Total: 173 mg/dL (ref 100–199)
HDL: 37 mg/dL — ABNORMAL LOW (ref >39)
Triglycerides: 178 mg/dL — ABNORMAL HIGH (ref 0–149)

## 2022-11-09 NOTE — Progress Notes (Signed)
-  Borderline low glucose noted on chemistry -Cholesterol show low good/HDL cholesterol, high bad/LDL cholesterol and high fats. The 10-year ASCVD risk score (Arnett DK, et al., 2019) is: 3.6% -I continue to recommend diet low in saturated fat and regular exercise - 30 min at least 5 times per week  All other labs are normal and stable.  An Ultrasound has been ordered for follow up on lymph node edema in L neck.

## 2022-11-10 LAB — LIPID PANEL: VLDL Cholesterol Cal: 31 mg/dL (ref 5–40)

## 2022-11-11 LAB — CBC WITH DIFFERENTIAL/PLATELET
Basophils Absolute: 0.1 10*3/uL (ref 0.0–0.2)
Basos: 2 %
EOS (ABSOLUTE): 0.5 10*3/uL — ABNORMAL HIGH (ref 0.0–0.4)
Eos: 7 %
Hematocrit: 45.8 % (ref 37.5–51.0)
Hemoglobin: 15.6 g/dL (ref 13.0–17.7)
Immature Grans (Abs): 0 10*3/uL (ref 0.0–0.1)
Immature Granulocytes: 0 %
Lymphocytes Absolute: 2 10*3/uL (ref 0.7–3.1)
Lymphs: 29 %
MCH: 31.4 pg (ref 26.6–33.0)
MCHC: 34.1 g/dL (ref 31.5–35.7)
MCV: 92 fL (ref 79–97)
Monocytes Absolute: 0.6 10*3/uL (ref 0.1–0.9)
Monocytes: 9 %
Neutrophils Absolute: 3.5 10*3/uL (ref 1.4–7.0)
Neutrophils: 53 %
Platelets: 252 10*3/uL (ref 150–450)
RBC: 4.97 x10E6/uL (ref 4.14–5.80)
RDW: 12.1 % (ref 11.6–15.4)
WBC: 6.7 10*3/uL (ref 3.4–10.8)

## 2022-11-11 LAB — COMPREHENSIVE METABOLIC PANEL
ALT: 15 IU/L (ref 0–44)
AST: 24 IU/L (ref 0–40)
Albumin/Globulin Ratio: 1.8 (ref 1.2–2.2)
Albumin: 4.4 g/dL (ref 4.1–5.1)
Alkaline Phosphatase: 48 IU/L (ref 44–121)
BUN/Creatinine Ratio: 10 (ref 9–20)
BUN: 11 mg/dL (ref 6–24)
Bilirubin Total: 0.3 mg/dL (ref 0.0–1.2)
CO2: 26 mmol/L (ref 20–29)
Calcium: 9.8 mg/dL (ref 8.7–10.2)
Chloride: 100 mmol/L (ref 96–106)
Creatinine, Ser: 1.07 mg/dL (ref 0.76–1.27)
Globulin, Total: 2.4 g/dL (ref 1.5–4.5)
Glucose: 57 mg/dL — ABNORMAL LOW (ref 70–99)
Potassium: 4.3 mmol/L (ref 3.5–5.2)
Sodium: 140 mmol/L (ref 134–144)
Total Protein: 6.8 g/dL (ref 6.0–8.5)
eGFR: 89 mL/min/{1.73_m2} (ref >59)

## 2022-11-11 LAB — TSH+FREE T4
Free T4: 1.57 ng/dL (ref 0.82–1.77)
TSH: 1.53 u[IU]/mL (ref 0.450–4.500)

## 2022-11-11 LAB — QUANTIFERON-TB GOLD PLUS
QuantiFERON Mitogen Value: >10 IU/mL
QuantiFERON Nil Value: 0.05 IU/mL
QuantiFERON TB1 Ag Value: 0.07 IU/mL
QuantiFERON TB2 Ag Value: 0.08 IU/mL
QuantiFERON-TB Gold Plus: NEGATIVE

## 2022-11-11 LAB — LIPID PANEL: LDL Chol Calc (NIH): 105 mg/dL — ABNORMAL HIGH (ref 0–99)

## 2022-11-11 LAB — HEPATITIS C ANTIBODY: Hep C Virus Ab: NONREACTIVE

## 2022-11-11 LAB — PSA: Prostate Specific Ag, Serum: 0.6 ng/mL (ref 0.0–4.0)

## 2022-11-11 LAB — HIV ANTIBODY (ROUTINE TESTING W REFLEX): HIV Screen 4th Generation wRfx: NONREACTIVE

## 2022-11-11 LAB — RPR: RPR Ser Ql: NONREACTIVE

## 2022-12-20 ENCOUNTER — Ambulatory Visit (INDEPENDENT_AMBULATORY_CARE_PROVIDER_SITE_OTHER): Payer: PRIVATE HEALTH INSURANCE | Admitting: Family Medicine

## 2022-12-20 DIAGNOSIS — Z91199 Patient's noncompliance with other medical treatment and regimen due to unspecified reason: Secondary | ICD-10-CM

## 2022-12-20 NOTE — Progress Notes (Signed)
Patient was not seen for appt d/t no call, no show, or late arrival >10 mins past appt time.   Elise T Payne, FNP  [facility] [address] [place], [place] [address] [phone] (phone) [phone] (fax) [facility]
